# Patient Record
Sex: Male | Born: 1945
Health system: Southern US, Community
[De-identification: ages and names within clinical notes are randomized; demographics above are authoritative.]

## PROBLEM LIST (undated history)

## (undated) DIAGNOSIS — I10 Essential (primary) hypertension: Secondary | ICD-10-CM

## (undated) DIAGNOSIS — F419 Anxiety disorder, unspecified: Secondary | ICD-10-CM

## (undated) HISTORY — PX: EYE SURGERY: SHX253

## (undated) HISTORY — PX: CYST EXCISION: SHX5701

## (undated) HISTORY — PX: SPINE SURGERY: SHX786

## (undated) HISTORY — PX: HEMORRHOID SURGERY: SHX153

## (undated) HISTORY — PX: PILONIDAL CYST EXCISION: SHX744

---

## 2003-01-16 ENCOUNTER — Ambulatory Visit (HOSPITAL_COMMUNITY): Admission: RE | Admit: 2003-01-16 | Discharge: 2003-01-16 | Payer: Self-pay | Admitting: Gastroenterology

## 2013-06-24 ENCOUNTER — Ambulatory Visit (INDEPENDENT_AMBULATORY_CARE_PROVIDER_SITE_OTHER): Payer: Medicare Other | Admitting: Family Medicine

## 2013-06-24 VITALS — BP 150/95 | HR 74 | Temp 97.8°F | Resp 18 | Ht 74.0 in | Wt 259.2 lb

## 2013-06-24 DIAGNOSIS — IMO0001 Reserved for inherently not codable concepts without codable children: Secondary | ICD-10-CM

## 2013-06-24 DIAGNOSIS — R030 Elevated blood-pressure reading, without diagnosis of hypertension: Secondary | ICD-10-CM

## 2013-06-24 DIAGNOSIS — L259 Unspecified contact dermatitis, unspecified cause: Secondary | ICD-10-CM

## 2013-06-24 MED ORDER — TRIAMCINOLONE ACETONIDE 0.1 % EX CREA: 1.0000 "application " | TOPICAL_CREAM | Freq: Two times a day (BID) | CUTANEOUS | Status: DC

## 2013-06-24 NOTE — Patient Instructions (Signed)
Try the Aveeno lotion to dry skin twice per day. Can try prescription steroid cream if needed up to twice per day. Keep a record of your blood pressures outside of the office and bring them to the next office visit. If remains over 140/90 - recommend recheck in next 2 weeks.  Return to the clinic or go to the nearest emergency room if any of your symptoms worsen or new symptoms occur. Contact Dermatitis Contact dermatitis is a reaction to certain substances that touch the skin. Contact dermatitis can be either irritant contact dermatitis or allergic contact dermatitis. Irritant contact dermatitis does not require previous exposure to the substance for a reaction to occur.Allergic contact dermatitis only occurs if you have been exposed to the substance before. Upon a repeat exposure, your body reacts to the substance.  CAUSES  Many substances can cause contact dermatitis. Irritant dermatitis is most commonly caused by repeated exposure to mildly irritating substances, such as:  Makeup.  Soaps.  Detergents.  Bleaches.  Acids.  Metal salts, such as nickel. Allergic contact dermatitis is most commonly caused by exposure to:  Poisonous plants.  Chemicals (deodorants, shampoos).  Jewelry.  Latex.  Neomycin in triple antibiotic cream.  Preservatives in products, including clothing. SYMPTOMS  The area of skin that is exposed may develop:  Dryness or flaking.  Redness.  Cracks.  Itching.  Pain or a burning sensation.  Blisters. With allergic contact dermatitis, there may also be swelling in areas such as the eyelids, mouth, or genitals.  DIAGNOSIS  Your caregiver can usually tell what the problem is by doing a physical exam. In cases where the cause is uncertain and an allergic contact dermatitis is suspected, a patch skin test may be performed to help determine the cause of your dermatitis. TREATMENT Treatment includes protecting the skin from further contact with the  irritating substance by avoiding that substance if possible. Barrier creams, powders, and gloves may be helpful. Your caregiver may also recommend:  Steroid creams or ointments applied 2 times daily. For best results, soak the rash area in cool water for 20 minutes. Then apply the medicine. Cover the area with a plastic wrap. You can store the steroid cream in the refrigerator for a "chilly" effect on your rash. That may decrease itching. Oral steroid medicines may be needed in more severe cases.  Antibiotics or antibacterial ointments if a skin infection is present.  Antihistamine lotion or an antihistamine taken by mouth to ease itching.  Lubricants to keep moisture in your skin.  Burow's solution to reduce redness and soreness or to dry a weeping rash. Mix one packet or tablet of solution in 2 cups cool water. Dip a clean washcloth in the mixture, wring it out a bit, and put it on the affected area. Leave the cloth in place for 30 minutes. Do this as often as possible throughout the day.  Taking several cornstarch or baking soda baths daily if the area is too large to cover with a washcloth. Harsh chemicals, such as alkalis or acids, can cause skin damage that is like a burn. You should flush your skin for 15 to 20 minutes with cold water after such an exposure. You should also seek immediate medical care after exposure. Bandages (dressings), antibiotics, and pain medicine may be needed for severely irritated skin.  HOME CARE INSTRUCTIONS  Avoid the substance that caused your reaction.  Keep the area of skin that is affected away from hot water, soap, sunlight, chemicals, acidic substances, or  anything else that would irritate your skin.  Do not scratch the rash. Scratching may cause the rash to become infected.  You may take cool baths to help stop the itching.  Only take over-the-counter or prescription medicines as directed by your caregiver.  See your caregiver for follow-up care as  directed to make sure your skin is healing properly. SEEK MEDICAL CARE IF:   Your condition is not better after 3 days of treatment.  You seem to be getting worse.  You see signs of infection such as swelling, tenderness, redness, soreness, or warmth in the affected area.  You have any problems related to your medicines. Document Released: 04/21/2000 Document Revised: 07/17/2011 Document Reviewed: 09/27/2010 [facility] Patient Information 2014 [place], [place].

## 2013-06-24 NOTE — Progress Notes (Addendum)
Subjective:    Patient ID: Jesse Sharp, male    DOB: Jul 09, 1945, 68 y.o.   MRN: [id number] .This chart was scribed for Merri Ray, MD by Anastasia Pall, ED Scribe. This patient was seen in room 01 and the patient's care was started at 2:22 PM.  Chief Complaint  Patient presents with  . Rash    Lower legs    Rash Pertinent negatives include no fever.   Jesse Sharp is a 68 y.o. male Pt presents with a non itching, non painful rash over his bilateral shins, onset 2 weeks ago, left LE greater than right. He reports wearing socks recently after not wearing socks for a long period of time. He has tried OTC antibacterial ointment without relief. He denies genital rash, rash over the bottom of his feet. He denies having pets at home. He denies fever, chills, abdominal pain. He reports recently having elevated BP, but reports being healthy otherwise. He denies anyone else he has been in contact with having similar rash.   PCP - No primary provider on file.  There are no active problems to display for this patient.  No past medical history on file. No past surgical history on file. No Known Allergies Prior to Admission medications   Medication Sig Start Date End Date Taking? Authorizing Provider  simvastatin (ZOCOR) 20 MG tablet Take 20 mg by mouth daily.   Yes Historical Provider, MD   Review of Systems  Constitutional: Negative for fever and chills.  Gastrointestinal: Negative for abdominal pain.  Skin: Positive for rash (bilateral shins).      Objective:   Physical Exam  Nursing note and vitals reviewed. Constitutional: He is oriented to person, place, and time. He appears well-developed and well-nourished. No distress.  HENT:  Head: Normocephalic and atraumatic.  Eyes: EOM are normal. Pupils are equal, round, and reactive to light.  Neck: Neck supple. Carotid bruit is not present.  Cardiovascular: Normal rate, regular rhythm and normal heart sounds.   No murmur  heard. DP 2 +.  Pulmonary/Chest: Effort normal and breath sounds normal. No respiratory distress. He has no wheezes. He has no rales.  Abdominal: Soft. There is no tenderness.  Musculoskeletal: Normal range of motion. He exhibits no edema.  Neurological: He is alert and oriented to person, place, and time. No sensory deficit.  Neurovascular intact distally.   Skin: Skin is warm and dry. Rash noted.  Dry skin right LE. Sparsity of LE hair. Right LE few small erythematous papules with minimal excoriation. Left LE anterior surface mid tibia patch of confluent erythematous papules with slight excoriations. No surrounding erythema. No induration. No other rash on body.   Psychiatric: He has a normal mood and affect. His behavior is normal.   BP 142/100  Pulse 74  Temp(Src) 97.8 F (36.6 C) (Oral)  Resp 18  Ht 6\' 2"  (1.88 m)  Wt 259 lb 3.2 oz (117.572 kg)  BMI 33.27 kg/m2  SpO2 97%  BP recheck - 150/95    Assessment & Plan:   Jesse Sharp is a 68 y.o. male Elevated BP - no known hx of htn. Check outside bp's as below, rtc precautions discussed.   Contact dermatitis - dry skin on legs, possible secondary contact dermatitis. Stop topical antibiotic as possible neomycin irritation. Trial of Aveeno topical BID and topical TAC 0.1% BID PRN. If not improving in next 7-10 days recheck sooner if worse.    Meds ordered this encounter           .  triamcinolone cream (KENALOG) 0.1 %    Sig: Apply 1 application topically 2 (two) times daily.    Dispense:  30 g    Refill:  0      Patient Instructions  Try the Aveeno lotion to dry skin twice per day. Can try prescription steroid cream if needed up to twice per day. Keep a record of your blood pressures outside of the office and bring them to the next office visit. If remains over 140/90 - recommend recheck in next 2 weeks.  Return to the clinic or go to the nearest emergency room if any of your symptoms worsen or new symptoms  occur. Contact Dermatitis Contact dermatitis is a reaction to certain substances that touch the skin. Contact dermatitis can be either irritant contact dermatitis or allergic contact dermatitis. Irritant contact dermatitis does not require previous exposure to the substance for a reaction to occur.Allergic contact dermatitis only occurs if you have been exposed to the substance before. Upon a repeat exposure, your body reacts to the substance.  CAUSES  Many substances can cause contact dermatitis. Irritant dermatitis is most commonly caused by repeated exposure to mildly irritating substances, such as:  Makeup.  Soaps.  Detergents.  Bleaches.  Acids.  Metal salts, such as nickel. Allergic contact dermatitis is most commonly caused by exposure to:  Poisonous plants.  Chemicals (deodorants, shampoos).  Jewelry.  Latex.  Neomycin in triple antibiotic cream.  Preservatives in products, including clothing. SYMPTOMS  The area of skin that is exposed may develop:  Dryness or flaking.  Redness.  Cracks.  Itching.  Pain or a burning sensation.  Blisters. With allergic contact dermatitis, there may also be swelling in areas such as the eyelids, mouth, or genitals.  DIAGNOSIS  Your caregiver can usually tell what the problem is by doing a physical exam. In cases where the cause is uncertain and an allergic contact dermatitis is suspected, a patch skin test may be performed to help determine the cause of your dermatitis. TREATMENT Treatment includes protecting the skin from further contact with the irritating substance by avoiding that substance if possible. Barrier creams, powders, and gloves may be helpful. Your caregiver may also recommend:  Steroid creams or ointments applied 2 times daily. For best results, soak the rash area in cool water for 20 minutes. Then apply the medicine. Cover the area with a plastic wrap. You can store the steroid cream in the refrigerator for a  "chilly" effect on your rash. That may decrease itching. Oral steroid medicines may be needed in more severe cases.  Antibiotics or antibacterial ointments if a skin infection is present.  Antihistamine lotion or an antihistamine taken by mouth to ease itching.  Lubricants to keep moisture in your skin.  Burow's solution to reduce redness and soreness or to dry a weeping rash. Mix one packet or tablet of solution in 2 cups cool water. Dip a clean washcloth in the mixture, wring it out a bit, and put it on the affected area. Leave the cloth in place for 30 minutes. Do this as often as possible throughout the day.  Taking several cornstarch or baking soda baths daily if the area is too large to cover with a washcloth. Harsh chemicals, such as alkalis or acids, can cause skin damage that is like a burn. You should flush your skin for 15 to 20 minutes with cold water after such an exposure. You should also seek immediate medical care after exposure. Bandages (dressings), antibiotics, and pain  medicine may be needed for severely irritated skin.  HOME CARE INSTRUCTIONS  Avoid the substance that caused your reaction.  Keep the area of skin that is affected away from hot water, soap, sunlight, chemicals, acidic substances, or anything else that would irritate your skin.  Do not scratch the rash. Scratching may cause the rash to become infected.  You may take cool baths to help stop the itching.  Only take over-the-counter or prescription medicines as directed by your caregiver.  See your caregiver for follow-up care as directed to make sure your skin is healing properly. SEEK MEDICAL CARE IF:   Your condition is not better after 3 days of treatment.  You seem to be getting worse.  You see signs of infection such as swelling, tenderness, redness, soreness, or warmth in the affected area.  You have any problems related to your medicines. Document Released: 04/21/2000 Document Revised:  07/17/2011 Document Reviewed: 09/27/2010 [facility] Patient Information 2014 [place], [place].

## 2018-05-16 ENCOUNTER — Other Ambulatory Visit: Payer: Self-pay | Admitting: Internal Medicine

## 2018-05-16 ENCOUNTER — Ambulatory Visit
Admission: RE | Admit: 2018-05-16 | Discharge: 2018-05-16 | Disposition: A | Discharge status: Home / Self Care | Payer: Self-pay | Source: Ambulatory Visit | Attending: Internal Medicine | Admitting: Internal Medicine

## 2018-05-16 DIAGNOSIS — R0789 Other chest pain: Secondary | ICD-10-CM

## 2019-03-17 ENCOUNTER — Other Ambulatory Visit: Payer: Self-pay | Admitting: Internal Medicine

## 2019-03-17 DIAGNOSIS — Z136 Encounter for screening for cardiovascular disorders: Secondary | ICD-10-CM

## 2019-07-22 DIAGNOSIS — M9903 Segmental and somatic dysfunction of lumbar region: Secondary | ICD-10-CM | POA: Diagnosis not present

## 2019-07-22 DIAGNOSIS — M5116 Intervertebral disc disorders with radiculopathy, lumbar region: Secondary | ICD-10-CM | POA: Diagnosis not present

## 2019-07-23 DIAGNOSIS — I10 Essential (primary) hypertension: Secondary | ICD-10-CM | POA: Diagnosis not present

## 2019-08-26 DIAGNOSIS — M9903 Segmental and somatic dysfunction of lumbar region: Secondary | ICD-10-CM | POA: Diagnosis not present

## 2019-08-26 DIAGNOSIS — M5116 Intervertebral disc disorders with radiculopathy, lumbar region: Secondary | ICD-10-CM | POA: Diagnosis not present

## 2019-09-23 DIAGNOSIS — M9903 Segmental and somatic dysfunction of lumbar region: Secondary | ICD-10-CM | POA: Diagnosis not present

## 2019-09-23 DIAGNOSIS — M5116 Intervertebral disc disorders with radiculopathy, lumbar region: Secondary | ICD-10-CM | POA: Diagnosis not present

## 2019-09-30 DIAGNOSIS — E785 Hyperlipidemia, unspecified: Secondary | ICD-10-CM | POA: Diagnosis not present

## 2019-09-30 DIAGNOSIS — I10 Essential (primary) hypertension: Secondary | ICD-10-CM | POA: Diagnosis not present

## 2019-10-03 DIAGNOSIS — I10 Essential (primary) hypertension: Secondary | ICD-10-CM | POA: Diagnosis not present

## 2019-10-03 DIAGNOSIS — I440 Atrioventricular block, first degree: Secondary | ICD-10-CM | POA: Diagnosis not present

## 2019-10-03 DIAGNOSIS — Z23 Encounter for immunization: Secondary | ICD-10-CM | POA: Diagnosis not present

## 2019-10-03 DIAGNOSIS — R42 Dizziness and giddiness: Secondary | ICD-10-CM | POA: Diagnosis not present

## 2019-10-14 DIAGNOSIS — M9903 Segmental and somatic dysfunction of lumbar region: Secondary | ICD-10-CM | POA: Diagnosis not present

## 2019-10-14 DIAGNOSIS — M5116 Intervertebral disc disorders with radiculopathy, lumbar region: Secondary | ICD-10-CM | POA: Diagnosis not present

## 2019-11-11 DIAGNOSIS — M5116 Intervertebral disc disorders with radiculopathy, lumbar region: Secondary | ICD-10-CM | POA: Diagnosis not present

## 2019-11-11 DIAGNOSIS — M9903 Segmental and somatic dysfunction of lumbar region: Secondary | ICD-10-CM | POA: Diagnosis not present

## 2019-12-02 DIAGNOSIS — H35371 Puckering of macula, right eye: Secondary | ICD-10-CM | POA: Diagnosis not present

## 2019-12-02 DIAGNOSIS — Z961 Presence of intraocular lens: Secondary | ICD-10-CM | POA: Diagnosis not present

## 2019-12-16 DIAGNOSIS — M9903 Segmental and somatic dysfunction of lumbar region: Secondary | ICD-10-CM | POA: Diagnosis not present

## 2019-12-16 DIAGNOSIS — M5116 Intervertebral disc disorders with radiculopathy, lumbar region: Secondary | ICD-10-CM | POA: Diagnosis not present

## 2019-12-23 DIAGNOSIS — M5116 Intervertebral disc disorders with radiculopathy, lumbar region: Secondary | ICD-10-CM | POA: Diagnosis not present

## 2019-12-23 DIAGNOSIS — M9903 Segmental and somatic dysfunction of lumbar region: Secondary | ICD-10-CM | POA: Diagnosis not present

## 2020-01-13 DIAGNOSIS — M5116 Intervertebral disc disorders with radiculopathy, lumbar region: Secondary | ICD-10-CM | POA: Diagnosis not present

## 2020-01-13 DIAGNOSIS — M9903 Segmental and somatic dysfunction of lumbar region: Secondary | ICD-10-CM | POA: Diagnosis not present

## 2020-02-02 DIAGNOSIS — Z0001 Encounter for general adult medical examination with abnormal findings: Secondary | ICD-10-CM | POA: Diagnosis not present

## 2020-02-02 DIAGNOSIS — Z1211 Encounter for screening for malignant neoplasm of colon: Secondary | ICD-10-CM | POA: Diagnosis not present

## 2020-02-02 DIAGNOSIS — G4733 Obstructive sleep apnea (adult) (pediatric): Secondary | ICD-10-CM | POA: Diagnosis not present

## 2020-02-02 DIAGNOSIS — M7062 Trochanteric bursitis, left hip: Secondary | ICD-10-CM | POA: Diagnosis not present

## 2020-02-03 DIAGNOSIS — Z0001 Encounter for general adult medical examination with abnormal findings: Secondary | ICD-10-CM | POA: Diagnosis not present

## 2020-02-03 DIAGNOSIS — E785 Hyperlipidemia, unspecified: Secondary | ICD-10-CM | POA: Diagnosis not present

## 2020-02-04 ENCOUNTER — Ambulatory Visit
Admission: RE | Admit: 2020-02-04 | Discharge: 2020-02-04 | Disposition: A | Discharge status: Home / Self Care | Payer: Medicare Other | Source: Ambulatory Visit | Attending: Internal Medicine | Admitting: Internal Medicine

## 2020-02-04 DIAGNOSIS — Z136 Encounter for screening for cardiovascular disorders: Secondary | ICD-10-CM

## 2020-02-04 DIAGNOSIS — Z87891 Personal history of nicotine dependence: Secondary | ICD-10-CM | POA: Diagnosis not present

## 2020-02-05 ENCOUNTER — Ambulatory Visit: Payer: Self-pay

## 2020-02-12 DIAGNOSIS — M9903 Segmental and somatic dysfunction of lumbar region: Secondary | ICD-10-CM | POA: Diagnosis not present

## 2020-02-12 DIAGNOSIS — M5116 Intervertebral disc disorders with radiculopathy, lumbar region: Secondary | ICD-10-CM | POA: Diagnosis not present

## 2020-02-16 DIAGNOSIS — I10 Essential (primary) hypertension: Secondary | ICD-10-CM | POA: Diagnosis not present

## 2020-02-16 DIAGNOSIS — R059 Cough, unspecified: Secondary | ICD-10-CM | POA: Diagnosis not present

## 2020-02-16 DIAGNOSIS — R490 Dysphonia: Secondary | ICD-10-CM | POA: Diagnosis not present

## 2020-02-17 DIAGNOSIS — R059 Cough, unspecified: Secondary | ICD-10-CM | POA: Diagnosis not present

## 2020-02-17 DIAGNOSIS — K2970 Gastritis, unspecified, without bleeding: Secondary | ICD-10-CM | POA: Diagnosis not present

## 2020-02-17 DIAGNOSIS — K319 Disease of stomach and duodenum, unspecified: Secondary | ICD-10-CM | POA: Diagnosis not present

## 2020-02-17 DIAGNOSIS — K2971 Gastritis, unspecified, with bleeding: Secondary | ICD-10-CM | POA: Diagnosis not present

## 2020-03-04 DIAGNOSIS — M5116 Intervertebral disc disorders with radiculopathy, lumbar region: Secondary | ICD-10-CM | POA: Diagnosis not present

## 2020-03-04 DIAGNOSIS — M9903 Segmental and somatic dysfunction of lumbar region: Secondary | ICD-10-CM | POA: Diagnosis not present

## 2020-03-17 DIAGNOSIS — R490 Dysphonia: Secondary | ICD-10-CM | POA: Diagnosis not present

## 2020-03-17 DIAGNOSIS — D380 Neoplasm of uncertain behavior of larynx: Secondary | ICD-10-CM | POA: Diagnosis not present

## 2020-03-18 ENCOUNTER — Encounter (HOSPITAL_BASED_OUTPATIENT_CLINIC_OR_DEPARTMENT_OTHER): Payer: Self-pay | Admitting: Otolaryngology

## 2020-03-18 ENCOUNTER — Other Ambulatory Visit: Payer: Self-pay

## 2020-03-19 ENCOUNTER — Other Ambulatory Visit (HOSPITAL_COMMUNITY)
Admission: RE | Admit: 2020-03-19 | Discharge: 2020-03-19 | Disposition: A | Discharge status: Home / Self Care | Payer: Medicare Other | Source: Ambulatory Visit | Attending: Otolaryngology | Admitting: Otolaryngology

## 2020-03-19 ENCOUNTER — Encounter (HOSPITAL_BASED_OUTPATIENT_CLINIC_OR_DEPARTMENT_OTHER)
Admission: RE | Admit: 2020-03-19 | Discharge: 2020-03-19 | Disposition: A | Discharge status: Home / Self Care | Payer: Medicare Other | Source: Ambulatory Visit | Attending: General Surgery | Admitting: General Surgery

## 2020-03-19 DIAGNOSIS — Z01812 Encounter for preprocedural laboratory examination: Secondary | ICD-10-CM | POA: Diagnosis not present

## 2020-03-19 DIAGNOSIS — Z20822 Contact with and (suspected) exposure to covid-19: Secondary | ICD-10-CM | POA: Diagnosis not present

## 2020-03-19 DIAGNOSIS — Z01810 Encounter for preprocedural cardiovascular examination: Secondary | ICD-10-CM | POA: Insufficient documentation

## 2020-03-19 LAB — SARS CORONAVIRUS 2 (TAT 6-24 HRS): SARS Coronavirus 2: NEGATIVE

## 2020-03-23 ENCOUNTER — Encounter (HOSPITAL_BASED_OUTPATIENT_CLINIC_OR_DEPARTMENT_OTHER)
Admission: RE | Disposition: A | Discharge status: Home / Self Care | Payer: Self-pay | Source: Home / Self Care | Attending: Otolaryngology

## 2020-03-23 ENCOUNTER — Ambulatory Visit (HOSPITAL_BASED_OUTPATIENT_CLINIC_OR_DEPARTMENT_OTHER): Payer: Medicare Other | Admitting: Anesthesiology

## 2020-03-23 ENCOUNTER — Other Ambulatory Visit: Payer: Self-pay

## 2020-03-23 ENCOUNTER — Encounter (HOSPITAL_BASED_OUTPATIENT_CLINIC_OR_DEPARTMENT_OTHER): Payer: Self-pay | Admitting: Otolaryngology

## 2020-03-23 ENCOUNTER — Ambulatory Visit (HOSPITAL_BASED_OUTPATIENT_CLINIC_OR_DEPARTMENT_OTHER)
Admission: RE | Admit: 2020-03-23 | Discharge: 2020-03-23 | Disposition: A | Discharge status: Home / Self Care | Payer: Medicare Other | Attending: Otolaryngology | Admitting: Otolaryngology

## 2020-03-23 DIAGNOSIS — I10 Essential (primary) hypertension: Secondary | ICD-10-CM | POA: Diagnosis not present

## 2020-03-23 DIAGNOSIS — R490 Dysphonia: Secondary | ICD-10-CM | POA: Insufficient documentation

## 2020-03-23 DIAGNOSIS — J382 Nodules of vocal cords: Secondary | ICD-10-CM | POA: Diagnosis not present

## 2020-03-23 DIAGNOSIS — Z87891 Personal history of nicotine dependence: Secondary | ICD-10-CM | POA: Insufficient documentation

## 2020-03-23 DIAGNOSIS — C320 Malignant neoplasm of glottis: Secondary | ICD-10-CM | POA: Diagnosis not present

## 2020-03-23 DIAGNOSIS — D380 Neoplasm of uncertain behavior of larynx: Secondary | ICD-10-CM | POA: Diagnosis not present

## 2020-03-23 HISTORY — PX: MICROLARYNGOSCOPY: SHX5208

## 2020-03-23 HISTORY — DX: Anxiety disorder, unspecified: F41.9

## 2020-03-23 HISTORY — DX: Essential (primary) hypertension: I10

## 2020-03-23 SURGERY — MICROLARYNGOSCOPY
Anesthesia: General | Site: Throat

## 2020-03-23 MED ORDER — FENTANYL CITRATE (PF) 100 MCG/2ML IJ SOLN
INTRAMUSCULAR | Status: DC | PRN
  Administered 2020-03-23: 100 ug via INTRAVENOUS

## 2020-03-23 MED ORDER — LIDOCAINE 2% (20 MG/ML) 5 ML SYRINGE
INTRAMUSCULAR | Status: AC
  Filled 2020-03-23: qty 5

## 2020-03-23 MED ORDER — EPHEDRINE SULFATE 50 MG/ML IJ SOLN
INTRAMUSCULAR | Status: DC | PRN
  Administered 2020-03-23: 10 mg via INTRAVENOUS
  Administered 2020-03-23 (×2): 15 mg via INTRAVENOUS

## 2020-03-23 MED ORDER — EPINEPHRINE PF 1 MG/ML IJ SOLN
INTRAMUSCULAR | Status: DC | PRN
  Administered 2020-03-23: .5 mL

## 2020-03-23 MED ORDER — LIDOCAINE HCL (CARDIAC) PF 100 MG/5ML IV SOSY
PREFILLED_SYRINGE | INTRAVENOUS | Status: DC | PRN
  Administered 2020-03-23: 100 mg via INTRAVENOUS

## 2020-03-23 MED ORDER — FENTANYL CITRATE (PF) 100 MCG/2ML IJ SOLN
INTRAMUSCULAR | Status: AC
  Filled 2020-03-23: qty 2

## 2020-03-23 MED ORDER — ONDANSETRON HCL 4 MG/2ML IJ SOLN
INTRAMUSCULAR | Status: AC
  Filled 2020-03-23: qty 2

## 2020-03-23 MED ORDER — SUGAMMADEX SODIUM 500 MG/5ML IV SOLN
INTRAVENOUS | Status: DC | PRN
  Administered 2020-03-23: 400 mg via INTRAVENOUS

## 2020-03-23 MED ORDER — PROPOFOL 10 MG/ML IV BOLUS
INTRAVENOUS | Status: AC
  Filled 2020-03-23: qty 20

## 2020-03-23 MED ORDER — ONDANSETRON HCL 4 MG/2ML IJ SOLN
INTRAMUSCULAR | Status: DC | PRN
  Administered 2020-03-23: 4 mg via INTRAVENOUS

## 2020-03-23 MED ORDER — CEFAZOLIN SODIUM-DEXTROSE 2-3 GM-%(50ML) IV SOLR
INTRAVENOUS | Status: DC | PRN
  Administered 2020-03-23: 2 g via INTRAVENOUS

## 2020-03-23 MED ORDER — ROCURONIUM BROMIDE 100 MG/10ML IV SOLN
INTRAVENOUS | Status: DC | PRN
  Administered 2020-03-23: 50 mg via INTRAVENOUS

## 2020-03-23 MED ORDER — FENTANYL CITRATE (PF) 100 MCG/2ML IJ SOLN: 25.0000 ug | INTRAMUSCULAR | Status: DC | PRN

## 2020-03-23 MED ORDER — DEXAMETHASONE SODIUM PHOSPHATE 4 MG/ML IJ SOLN
INTRAMUSCULAR | Status: DC | PRN
  Administered 2020-03-23: 10 mg via INTRAVENOUS

## 2020-03-23 MED ORDER — LACTATED RINGERS IV SOLN: INTRAVENOUS | Status: DC

## 2020-03-23 MED ORDER — GLYCOPYRROLATE PF 0.2 MG/ML IJ SOSY
PREFILLED_SYRINGE | INTRAMUSCULAR | Status: AC
  Filled 2020-03-23: qty 1

## 2020-03-23 MED ORDER — PROPOFOL 10 MG/ML IV BOLUS
INTRAVENOUS | Status: DC | PRN
  Administered 2020-03-23: 150 mg via INTRAVENOUS

## 2020-03-23 MED ORDER — ACETAMINOPHEN 500 MG PO TABS
1000.0000 mg | ORAL_TABLET | Freq: Once | ORAL | Status: AC
  Administered 2020-03-23: 1000 mg via ORAL

## 2020-03-23 MED ORDER — ACETAMINOPHEN 500 MG PO TABS
ORAL_TABLET | ORAL | Status: AC
  Filled 2020-03-23: qty 2

## 2020-03-23 MED ORDER — SODIUM CHLORIDE 0.9 % IV SOLN
INTRAVENOUS | Status: DC | PRN
  Administered 2020-03-23: 120 ug via INTRAVENOUS

## 2020-03-23 MED ORDER — DEXAMETHASONE SODIUM PHOSPHATE 10 MG/ML IJ SOLN
INTRAMUSCULAR | Status: AC
  Filled 2020-03-23: qty 1

## 2020-03-23 SURGICAL SUPPLY — 23 items
CANISTER SUCT 1200ML W/VALVE (MISCELLANEOUS) ×3 IMPLANT
COVER WAND RF STERILE (DRAPES) IMPLANT
GAUZE SPONGE 4X4 12PLY STRL LF (GAUZE/BANDAGES/DRESSINGS) ×6 IMPLANT
GLOVE BIO SURGEON STRL SZ7.5 (GLOVE) ×3 IMPLANT
GLOVE ECLIPSE 6.5 STRL STRAW (GLOVE) ×3 IMPLANT
GOWN STRL REUS W/ TWL LRG LVL3 (GOWN DISPOSABLE) ×2 IMPLANT
GOWN STRL REUS W/TWL LRG LVL3 (GOWN DISPOSABLE) ×3
GUARD TEETH (MISCELLANEOUS) ×3 IMPLANT
MARKER SKIN DUAL TIP RULER LAB (MISCELLANEOUS) IMPLANT
NEEDLE HYPO 18GX1.5 BLUNT FILL (NEEDLE) ×3 IMPLANT
NEEDLE SPNL 22GX7 QUINCKE BK (NEEDLE) IMPLANT
NEEDLE SPNL 25GX3.5 QUINCKE BL (NEEDLE) ×3 IMPLANT
NS IRRIG 1000ML POUR BTL (IV SOLUTION) ×3 IMPLANT
PACK BASIN DAY SURGERY FS (CUSTOM PROCEDURE TRAY) ×3 IMPLANT
PATTIES SURGICAL .5 X3 (DISPOSABLE) ×3 IMPLANT
SHEET MEDIUM DRAPE 40X70 STRL (DRAPES) ×3 IMPLANT
SLEEVE SCD COMPRESS KNEE MED (MISCELLANEOUS) ×3 IMPLANT
SOLUTION BUTLER CLEAR DIP (MISCELLANEOUS) ×3 IMPLANT
SURGILUBE 2OZ TUBE FLIPTOP (MISCELLANEOUS) IMPLANT
SYR CONTROL 10ML LL (SYRINGE) IMPLANT
SYR TB 1ML LL NO SAFETY (SYRINGE) ×3 IMPLANT
TOWEL GREEN STERILE FF (TOWEL DISPOSABLE) ×3 IMPLANT
TUBE CONNECTING 20X1/4 (TUBING) ×3 IMPLANT

## 2020-03-23 NOTE — Anesthesia Preprocedure Evaluation (Addendum)
Anesthesia Evaluation  Patient identified by MRN, date of birth, ID band Patient awake    Reviewed: Allergy & Precautions, NPO status , Patient's Chart, lab work & pertinent test results  Airway Mallampati: III  TM Distance: >3 FB Neck ROM: Full    Dental no notable dental hx. (+) Chipped, Dental Advisory Given,    Pulmonary neg pulmonary ROS, former smoker,    Pulmonary exam normal breath sounds clear to auscultation       Cardiovascular hypertension, Pt. on medications negative cardio ROS Normal cardiovascular exam Rhythm:Regular Rate:Normal     Neuro/Psych PSYCHIATRIC DISORDERS Anxiety negative neurological ROS     GI/Hepatic negative GI ROS, Neg liver ROS,   Endo/Other  negative endocrine ROS  Renal/GU negative Renal ROS  negative genitourinary   Musculoskeletal negative musculoskeletal ROS (+)   Abdominal   Peds  Hematology negative hematology ROS (+)   Anesthesia Other Findings   Reproductive/Obstetrics                            Anesthesia Physical Anesthesia Plan  ASA: II  Anesthesia Plan: General   Post-op Pain Management:    Induction: Intravenous  PONV Risk Score and Plan: 2 and Midazolam, Dexamethasone, Ondansetron and Propofol infusion  Airway Management Planned: Oral ETT  Additional Equipment:   Intra-op Plan:   Post-operative Plan: Extubation in OR  Informed Consent: I have reviewed the patients History and Physical, chart, labs and discussed the procedure including the risks, benefits and alternatives for the proposed anesthesia with the patient or authorized representative who has indicated his/her understanding and acceptance.     Dental advisory given  Plan Discussed with: CRNA  Anesthesia Plan Comments:        Anesthesia Quick Evaluation

## 2020-03-23 NOTE — Discharge Instructions (Signed)
Next dose of Tylenol if needed not until 3pm  Post Anesthesia Home Care Instructions  Activity: Get plenty of rest for the remainder of the day. A responsible individual must stay with you for 24 hours following the procedure.  For the next 24 hours, DO NOT: -Drive a car -Paediatric nurse -Drink alcoholic beverages -Take any medication unless instructed by your physician -Make any legal decisions or sign important papers.  Meals: Start with liquid foods such as gelatin or soup. Progress to regular foods as tolerated. Avoid greasy, spicy, heavy foods. If nausea and/or vomiting occur, drink only clear liquids until the nausea and/or vomiting subsides. Call your physician if vomiting continues.  Special Instructions/Symptoms: Your throat may feel dry or sore from the anesthesia or the breathing tube placed in your throat during surgery. If this causes discomfort, gargle with warm salt water. The discomfort should disappear within 24 hours.  If you had a scopolamine patch placed behind your ear for the management of post- operative nausea and/or vomiting:  1. The medication in the patch is effective for 72 hours, after which it should be removed.  Wrap patch in a tissue and discard in the trash. Wash hands thoroughly with soap and water. 2. You may remove the patch earlier than 72 hours if you experience unpleasant side effects which may include dry mouth, dizziness or visual disturbances. 3. Avoid touching the patch. Wash your hands with soap and water after contact with the patch.    -----------------------------  The patient may resume all his previous activities and diet.  He may use Tylenol/ibuprofen as needed for his pain.  He will follow-up in my office on December 1.

## 2020-03-23 NOTE — Transfer of Care (Addendum)
Immediate Anesthesia Transfer of Care Note  Patient: Jesse Sharp  Procedure(s) Performed: MICRO LARYNGOSCOPY WITH EXCISION OF VOCAL CORD NODULE (N/A Throat)  Patient Location: PACU  Anesthesia Type:General  Level of Consciousness: awake, alert  and oriented  Airway & Oxygen Therapy: Patient Spontanous Breathing and Patient connected to face mask oxygen  Post-op Assessment: Report given to RN and Post -op Vital signs reviewed and stable  Post vital signs: Reviewed and stable  Last Vitals:  Vitals Value Taken Time  BP    Temp    Pulse 114 03/23/20 1008  Resp 11 03/23/20 1008  SpO2 99 % 03/23/20 1008  Vitals shown include unvalidated device data.  Last Pain:  Vitals:   03/23/20 0757  TempSrc: Oral  PainSc: 0-No pain         Complications: No complications documented.

## 2020-03-23 NOTE — Op Note (Signed)
DATE OF PROCEDURE:  03/23/2020                              OPERATIVE REPORT  SURGEON:  Leta Baptist, MD  PREOPERATIVE DIAGNOSES: 1.  Chronic hoarseness. 2.  Vocal cord anterior commissure mass.  POSTOPERATIVE DIAGNOSES: 1.  Chronic hoarseness. 2.  Vocal cord anterior commissure mass.  PROCEDURE PERFORMED: MicroDirect laryngoscopy with excision of vocal cord mass.  ANESTHESIA:  General endotracheal tube anesthesia.  COMPLICATIONS:  None.  ESTIMATED BLOOD LOSS:  Minimal.  INDICATION FOR PROCEDURE:  Jesse Sharp is a 74 y.o. male with a history of chronic hoarseness. 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 Based on the above findings, the decision was made for the patient to undergo the above stated procedure. Likelihood of success in reducing symptoms was also discussed.  The risks, benefits, alternatives, and details of the procedure were discussed with the patient.  Questions were invited and answered.  Informed consent was obtained.  DESCRIPTION:  The patient was taken to the operating room and placed supine on the operating table.  General endotracheal tube anesthesia was administered by the anesthesiologist.  The patient was positioned and prepped and draped in a standard fashion for direct laryngoscopy.  A Dedo laryngoscope was used for examination.  The laryngoscope was inserted via the oral cavity into the pharynx.  Examination of the epiglottis, vallecula, and piriform sinuses were all normal.  Examination of the glottis revealed a 3 mm anterior commissure soft tissue mass.  The Dedo laryngoscope was suspended with a Lewy suspender.  A 0 degree endoscope was used to obtain photodocumentation of the larynx.  An operating microscope was brought into the surgical field.  Under the operating microscope, the soft tissue mass was excised using a combination of laryngeal forceps and laryngeal scissors.  The  specimen was sent to the pathology department for permanent histologic identification.  Hemostasis was achieved with pledgets soaked with epinephrine.  The care of the patient was turned over to the anesthesiologist.  The patient was awakened from anesthesia without difficulty.  The patient was extubated and transferred to the recovery room in good condition.  OPERATIVE FINDINGS:  A 78mm anterior commissure soft tissue mass.  SPECIMEN: Laryngeal mass.  FOLLOWUP CARE:  The patient will be discharged home once awake and alert. The patient will follow up in my office in approximately 2 weeks.  Shley Dolby W Kimari Coudriet 03/23/2020 10:06 AM

## 2020-03-23 NOTE — H&P (Signed)
Cc: Chronic hoarseness  HPI: The patient is a 74 y/o male who presents today for evaluation of hoarseness. The patient is seen in consultation requested by [facility]. The patient noted onset of hoarseness in June. At that time, he was unable to talk above a whisper. The patient denies any associated dysphagia or odynophagia. He had an upper endoscopy in October with no suspicious mass or lesion noted. He was started on omeprazole daily for possible laryngopharyngeal reflux. The patient has noted a slight improvement in his voice since starting the medication. The patient has noted some intermittent sinus drainage. He quit smoking in 1996. Previous ENT surgery is denied.   The patient's review of systems (constitutional, eyes, ENT, cardiovascular, respiratory, GI, musculoskeletal, skin, neurologic, psychiatric, endocrine, hematologic, allergic) is noted in the ROS questionnaire.  It is reviewed with the patient.   Family health history: No HTN, DM, CAD, hearing loss or bleeding disorder.  Major events: Cataract surgery.  Ongoing medical problems: Anxiety, reflux, hypertension.  Social history: The patient is single.  He denies the use of tobacco, alcohol, or illegal drugs.  Exam: General: Communicates without difficulty, well nourished, no acute distress. Head: Normocephalic, no evidence injury, no tenderness, facial buttresses intact without stepoff. Eyes: PERRL, EOMI.  No scleral icterus, conjunctivae clear. Ears: External auditory canals clear bilaterally.  There is no edema or erythema.  Tympanic membrane is within normal limits bilaterally. Nose: Normal skin and external support.  Anterior rhinoscopy reveals healthy pink mucosa over the septum and turbinates.  No lesions or polyps were seen. Oral cavity: Lips without lesions, oral mucosa moist, no masses or lesions seen. Indirect  mirror laryngoscopy could not be tolerated. Pharynx: Clear, no erythema. Neck: Supple, full range of  motion, no lymphadenopathy, no masses palpable. Salivary: Parotid and submandibular glands without mass. Neuro:  CN 2-12 grossly intact. Gait normal.   Procedure:  Flexible Fiberoptic Laryngoscopy -- Risks, benefits, and alternatives of flexible endoscopy were explained to the patient.  Specific mention was made of the risk of throat numbness with difficulty swallowing, possible bleeding from the nose and mouth, and pain from the procedure.  The patient gave oral consent to proceed.  The nasal cavities were decongested and anesthetised with a combination of oxymetazoline and 4% lidocaine solution.  The flexible scope was inserted into the right nasal cavity and advanced towards the nasopharynx.  Visualized mucosa over the turbinates and septum were normal.  The nasopharynx was clear.  Oropharyngeal walls were symmetric and mobile without lesion, mass, or edema.  Hypopharynx was also without  lesion or edema.  Larynx was mobile without lesions.  No lesions or asymmetry in the supraglottic larynx.  Arytenoid mucosa was edematous with slight erythema.  True vocal folds were pale yellow and edematous with an anterior commissure soft tissue mass.  Base of tongue was within normal limits. The patient tolerated the procedure well.   Assessment 1. 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 This is the cause of his hoarseness. No other suspicious mass or lesion is noted on today's fiberoptic laryngoscopy exam.  Plan  1. The physical exam and laryngoscopy findings are reviewed with the patient.  2. Recommend micro direct laryngoscopy with excision of vocal cord mass. The risks, benefits, alternatives, and details of the procedure are reviewed with the patient. Questions are invited and answered. 3. The patient is interested in proceeding with the procedure.  We will  schedule the procedure in accordance with the family schedule.

## 2020-03-23 NOTE — Anesthesia Postprocedure Evaluation (Signed)
Anesthesia Post Note  Patient: Jesse Sharp  Procedure(s) Performed: MICRO LARYNGOSCOPY WITH EXCISION OF VOCAL CORD NODULE (N/A Throat)     Patient location during evaluation: PACU Anesthesia Type: General Level of consciousness: awake and alert Pain management: pain level controlled Vital Signs Assessment: post-procedure vital signs reviewed and stable Respiratory status: spontaneous breathing, nonlabored ventilation, respiratory function stable and patient connected to nasal cannula oxygen Cardiovascular status: blood pressure returned to baseline and stable Postop Assessment: no apparent nausea or vomiting Anesthetic complications: no   No complications documented.  Last Vitals:  Vitals:   03/23/20 1030 03/23/20 1052  BP: 129/87 (!) 145/96  Pulse: (!) 101 93  Resp: 20 18  Temp:  36.5 C  SpO2: 96% 97%    Last Pain:  Vitals:   03/23/20 1052  TempSrc:   PainSc: 0-No pain                 Susannah Carbin L Maricella Filyaw

## 2020-03-23 NOTE — Anesthesia Procedure Notes (Signed)
Procedure Name: Intubation Performed by: Verita Lamb, CRNA Pre-anesthesia Checklist: Patient identified, Emergency Drugs available, Suction available and Patient being monitored Patient Re-evaluated:Patient Re-evaluated prior to induction Oxygen Delivery Method: Circle system utilized Preoxygenation: Pre-oxygenation with 100% oxygen Induction Type: IV induction Ventilation: Mask ventilation without difficulty Laryngoscope Size: Mac and 4 Grade View: Grade I Tube type: Oral Tube size: 6.0 mm Number of attempts: 1 Airway Equipment and Method: Stylet and Oral airway Placement Confirmation: ETT inserted through vocal cords under direct vision,  positive ETCO2,  breath sounds checked- equal and bilateral and CO2 detector Secured at: 24 cm Tube secured with: Tape Dental Injury: Teeth and Oropharynx as per pre-operative assessment

## 2020-03-24 ENCOUNTER — Encounter (HOSPITAL_BASED_OUTPATIENT_CLINIC_OR_DEPARTMENT_OTHER): Payer: Self-pay | Admitting: Otolaryngology

## 2020-03-24 LAB — SURGICAL PATHOLOGY

## 2020-04-05 NOTE — Progress Notes (Signed)
Head and Neck Cancer Location of Tumor / Histology:  Squamous cell carcinoma of vocal cord  Patient presented with symptoms of: new onset of vocal hoarseness in June. At that time, he was unable to talk above a whisper. He had an upper endoscopy in October with no suspicious mass or lesion noted. He was started on omeprazole daily for possible laryngopharyngeal reflux. Patient noted a slight improvement in his voice since starting the medication. The patient has noted some intermittent sinus drainage.   Biopsies revealed:  03/23/2020 FINAL MICROSCOPIC DIAGNOSIS:  A. VOCAL CORD MASS, BIOPSY:  - Squamous cell carcinoma.  Nutrition Status Yes No Comments  Weight changes? []  [x]    Swallowing concerns? []  [x]    PEG? []  [x]     Referrals Yes No Comments  Social Work? []  [x]    Dentistry? []  [x]    Swallowing therapy? [x]  []    Nutrition? [x]  []    Med/Onc? []  [x]     Safety Issues Yes No Comments  Prior radiation? []  [x]    Pacemaker/ICD? []  [x]    Possible current pregnancy? []  [x]  N/A  Is the patient on methotrexate? []  [x]     Tobacco/Marijuana/Snuff/ETOH use: Quit smoking in 1996; patient denies any current alcohol consumption or illicit drug use  Past/Anticipated interventions by otolaryngology, if any:  03/23/2020 Dr. Leta Baptist MicroDirect laryngoscopy with excision of vocal cord mass  Past/Anticipated interventions by medical oncology, if any:  No referral placed at this time   Current Complaints / other details:  Patient has received both Pfizer vaccines. He is originally from [place] but moved to [place] to be closer to his children and grandchildren

## 2020-04-05 NOTE — Progress Notes (Signed)
Oncology Nurse Navigator Documentation  Placed introductory call to new referral patient .....  Introduced myself as the H&N oncology nurse navigator that works with Dr. Isidore Moos to whom he has been referred by Dr. Benjamine Mola.  He confirmed understanding of referral.  Briefly explained my role as his navigator, provided my contact information.   Confirmed understanding of upcoming appts and [place] location, explained arrival and registration process.  I encouraged him to call with questions/concerns as he moves forward with appts and procedures.    He verbalized understanding of information provided, expressed appreciation for my call.   Navigator Initial Assessment-  . Employment Status: . Currently on FMLA / STD: . Living Situation: . Support System: . PCP: . PCD: . Financial Concerns: . Transportation Needs: no . Sensory Deficits: . Language Barriers/Interpreter Needed:  no . Ambulation Needs: no . DME Used in Home: no . Psychosocial Needs:  no . Concerns/Needs Understanding Cancer:  addressed/answered by navigator to best of ability . Self-Expressed Needs: no  *Jesse Sharp was driving when I called him. He was unable to talk for long. He will be seeing Dr. Isidore Moos tomorrow for consult and I will join them to continue my assessment.    Harlow Asa RN, BSN, OCN Head & Neck Oncology Nurse [place] at [facility] Phone # [phone]  Fax # [phone]

## 2020-04-06 ENCOUNTER — Other Ambulatory Visit: Payer: Self-pay

## 2020-04-06 ENCOUNTER — Telehealth: Payer: Self-pay | Admitting: *Deleted

## 2020-04-06 ENCOUNTER — Ambulatory Visit
Admission: RE | Admit: 2020-04-06 | Discharge: 2020-04-06 | Disposition: A | Discharge status: Home / Self Care | Payer: Medicare Other | Source: Ambulatory Visit | Attending: Radiation Oncology | Admitting: Radiation Oncology

## 2020-04-06 ENCOUNTER — Encounter: Payer: Self-pay | Admitting: Radiation Oncology

## 2020-04-06 VITALS — BP 109/80 | HR 84 | Temp 97.6°F | Resp 18 | Ht 76.0 in | Wt 244.4 lb

## 2020-04-06 DIAGNOSIS — C320 Malignant neoplasm of glottis: Secondary | ICD-10-CM | POA: Diagnosis not present

## 2020-04-06 DIAGNOSIS — I10 Essential (primary) hypertension: Secondary | ICD-10-CM | POA: Diagnosis not present

## 2020-04-06 DIAGNOSIS — F419 Anxiety disorder, unspecified: Secondary | ICD-10-CM | POA: Insufficient documentation

## 2020-04-06 DIAGNOSIS — Z87891 Personal history of nicotine dependence: Secondary | ICD-10-CM | POA: Insufficient documentation

## 2020-04-06 DIAGNOSIS — Z79899 Other long term (current) drug therapy: Secondary | ICD-10-CM | POA: Diagnosis not present

## 2020-04-06 NOTE — Progress Notes (Signed)
Radiation Oncology         [phone] ________________________________  Initial Outpatient Consultation  Name: Jesse Sharp MRN: [id number]  Date: 04/06/2020  DOB: May 23, 1945  [id number], Larey Dresser, MD  Leta Baptist, MD   REFERRING PHYSICIAN: Leta Baptist, MD  DIAGNOSIS:    ICD-10-CM   1. Squamous cell carcinoma of vocal cord (HCC)  C32.0    Cancer Staging Malignant neoplasm of glottis ([place]) Staging form: Larynx - Glottis, AJCC 8th Edition - Clinical stage from 04/06/2020: Stage I (cT1b, cN0, cM0) - Signed by Eppie Gibson, MD on 04/07/2020   CHIEF COMPLAINT: Here to discuss management of vocal cord cancer  HISTORY OF PRESENT ILLNESS::Jesse Sharp is a 74 y.o. male who presented with hoarseness beginning in June of this year. An upper endoscopy was performed in October, which did not show any suspicious mass or lesion.  Subsequently, the patient saw Dr. Benjamine Mola, who noted an anterior vocal cord soft tissue mass. A MicroDirect laryngoscopy with excision the vocal cord mass was performed on 03/23/2020. Pathology from the procedure revealed squamous cell carcinoma.  No pertinent imaging has been done thus far.  Swallowing issues, if any: no  Weight Changes: no  Pain status: no significant pain  Other symptoms: emotional distress, moderate, related to diagnosis  Tobacco history, if any: Quit smoking in 1996  ETOH abuse, if any: none currently  Prior cancers, if any: None   PREVIOUS RADIATION THERAPY: No  PAST MEDICAL HISTORY:  has a past medical history of Anxiety and Hypertension.    PAST SURGICAL HISTORY: Past Surgical History:  Procedure Laterality Date  . CYST EXCISION    . EYE SURGERY    . HEMORRHOID SURGERY    . MICROLARYNGOSCOPY N/A 03/23/2020   Procedure: MICRO LARYNGOSCOPY WITH EXCISION OF VOCAL CORD NODULE;  Surgeon: Leta Baptist, MD;  Location: [facility];  Service: ENT;  Laterality: N/A;  . PILONIDAL CYST EXCISION    . SPINE SURGERY       FAMILY HISTORY: family history is not on file.  SOCIAL HISTORY:  reports that he has quit smoking. He has never used smokeless tobacco. He reports previous alcohol use. He reports that he does not use drugs.  ALLERGIES: Patient has no known allergies.  MEDICATIONS:  Current Outpatient Medications  Medication Sig Dispense Refill  . ALPRAZolam (XANAX) 0.5 MG tablet Take 0.5 mg by mouth at bedtime as needed for anxiety.    Marland Kitchen losartan (COZAAR) 25 MG tablet Take 12.5 mg by mouth daily.    Marland Kitchen omeprazole (PRILOSEC) 40 MG capsule Take 40 mg by mouth 2 (two) times daily.    . simvastatin (ZOCOR) 20 MG tablet Take 20 mg by mouth daily.    Marland Kitchen triamcinolone cream (KENALOG) 0.1 % Apply 1 application topically 2 (two) times daily. 30 g 0   No current facility-administered medications for this encounter.    REVIEW OF SYSTEMS:  Notable for that above.   PHYSICAL EXAM:  height is 6\' 4"  (1.93 m) and weight is 244 lb 6 oz (110.8 kg). His temporal temperature is 97.6 F (36.4 C). His blood pressure is 109/80 and his pulse is 84. His respiration is 18 and oxygen saturation is 98%.   General: Alert and oriented, in no acute distress; hoarseness is mild HEENT: Head is normocephalic. Extraocular movements are intact. Oropharynx is notable for no lesions; no oral lesions. Neck: Neck is notable for no palpable masses Heart: Regular in rate and rhythm with no murmurs, rubs,  or gallops. Chest: Clear to auscultation bilaterally, with no rhonchi, wheezes, or rales. Abdomen: Soft, nontender, nondistended, with no rigidity or guarding. Extremities: No cyanosis or edema. Lymphatics: see Neck Exam Skin: No concerning lesions. Musculoskeletal: symmetric strength and muscle tone throughout. Neurologic: Cranial nerves II through XII are grossly intact. No obvious focalities. Speech is fluent. Coordination is intact. Psychiatric: Judgment and insight are intact. Affect is appropriate.   ECOG = 0  0 -  Asymptomatic (Fully active, able to carry on all predisease activities without restriction)  1 - Symptomatic but completely ambulatory (Restricted in physically strenuous activity but ambulatory and able to carry out work of a light or sedentary nature. For example, light housework, office work)  2 - Symptomatic, <50% in bed during the day (Ambulatory and capable of all self care but unable to carry out any work activities. Up and about more than 50% of waking hours)  3 - Symptomatic, >50% in bed, but not bedbound (Capable of only limited self-care, confined to bed or chair 50% or more of waking hours)  4 - Bedbound (Completely disabled. Cannot carry on any self-care. Totally confined to bed or chair)  5 - Death   Eustace Pen MM, Creech RH, Tormey DC, et al. [id number]). "Toxicity and response criteria of the [facility]". [place] Oncol. 5 (6): 649-55   LABORATORY DATA:  No results found for: WBC, HGB, HCT, MCV, PLT CMP  No results found for: NA, K, CL, CO2, GLUCOSE, BUN, CREATININE, CALCIUM, PROT, ALBUMIN, AST, ALT, ALKPHOS, BILITOT, GFRNONAA, GFRAA    No results found for: TSH   RADIOGRAPHY: No results found.    IMPRESSION/PLAN:  This is a delightful patient with head and neck cancer.  Initially during our discussion, he expressed he did not want to consider radiotherapy based on the initial logistics and side effects we discussed. I offered a referral to [facility]NT to discuss surgical options for cure. He is not interested in surgery, either. We also discussed close observation with ENT but I recommend he strongly consider oncologic clearance of margins (limited surgery, as I discussed with ENT, which would likely require supracricoid laryngectomy) or radiation therapy.    He then expressed he would consider radiation strongly. He declines an ENT referral to [facility].  I recommend a six week course of hypofractionated radiotherapy for this patient; he is concerned about this  length of treatment and asked if a 2 week course were available.  I stated this would not be appropriate, but acknowledged that a less standard but acceptable option is a 4 week hypofractionated course (higher dose/day than 6 weeks) which has yielded excellent results, as well, in my experience.   He will think about these options.  We discussed the potential risks, benefits, and side effects of radiotherapy. We talked in detail about acute and late effects. We discussed that some of the most bothersome acute effects may be mucositis, dysgeusia, salivary changes, skin irritation, hair loss, dehydration, weight loss and fatigue. We talked about late effects which include but are not necessarily limited to dysphagia, hypothyroidism, nerve injury, potential need for a feeding tube, vascular injury, voice box damage requiring surgery, potential injury to any of the tissues in the head and neck region. No guarantees of treatment were given. A consent form was signed and placed in the patient's medical record. The patient is leaning towards proceeding with treatment. I look forward to participating in the patient's care.    Unfortunately, laryngoscopy nasal spray was  not obtainable from our pharmacy today.  Pt would like to be scoped again to verify there is not new gross disease since bx.  We will obtain a CT neck and I will see him soon after to give the results, and scope him that day.  Simulation can take place that day, too.  We also discussed that the treatment of head and neck cancer is a multidisciplinary process to maximize treatment outcomes and quality of life. For this reason the following referrals have been or will be made:   Nutritionist for nutrition support during and after treatment.   Speech language pathology for swallowing and/or speech therapy.   Social work for social support.    Baseline labs including TSH.   We discussed measures to reduce the risk of infection during the COVID-19  pandemic. He is due for his booster. After a discussion, he would like to proceed. Anderson Malta Malmfelt will schedule his booster at the [facility].  On date of service, in total, I spent 65 minutes on this encounter. Patient was seen in person.  __________________________________________   Eppie Gibson, MD  This document serves as a record of services personally performed by Eppie Gibson, MD. It was created on his behalf by Clerance Lav, a trained medical scribe. The creation of this record is based on the scribe's personal observations and the provider's statements to them. This document has been checked and approved by the attending provider.

## 2020-04-06 NOTE — Progress Notes (Signed)
Oncology Nurse Navigator Documentation  Met with patient during initial consult with Dr. Isidore Moos.  . Further introduced myself as his/their Navigator, explained my role as a member of the Care Team. . Provided New Patient Information packet: o Contact information for physician, this navigator, other members of the Care Team o Advance Directive information ([place] blue pamphlet with LCSW insert); provided [facility] AD booklet at his request,  o Fall Prevention Patient [place] sheet o Symptom Management Clinic information o [facility] campus map with highlight of [place] o SLP Information sheet . Assisted with post-consult appt scheduling. He has been scheduled for 12/9 CT neck and 12/10 appointment with Dr. Isidore Moos and [place]. He is aware of these appointments.  Marland Kitchen He verbalized understanding of information provided. . I encouraged them to call with questions/concerns moving forward.   Navigator Assessment . Employment Status: He is working part time . Currently on FMLA / STD: no . Living Situation: He lives alone. . Support System: He has 3 children who are able to provide some support.  Marland Kitchen PCP: Latanya Presser . PCD: . Financial Concerns: no . Transportation Needs: no . Sensory Deficits: no . Language Barriers/Interpreter Needed:  no . Ambulation Needs: no . DME Used in Home: no . Psychosocial Needs:  no . Concerns/Needs Understanding Cancer:  addressed/answered by navigator to best of ability . Self-Expressed Needs: no   Harlow Asa RN, BSN, OCN Head & Neck Oncology Nurse [place] at [facility] Phone # [phone]  Fax # [phone]

## 2020-04-06 NOTE — Telephone Encounter (Signed)
Called patient to inform of lab on 04-15-20 @ 11:45 am @ [facility] and his CT on 04-15-20 - arrival time- 12:45 pm @ [facility], patient to have water only -4 hrs. prior to test, then patient will be seen by Dr. Isidore Moos on 04-16-20 - arrival time- 7:20 am @ [facility],, nurse- 7:30 am, Dr. Isidore Moos - 8 am and sim to follow @ 9 am, spoke with patient and he is aware of these appts.

## 2020-04-07 ENCOUNTER — Other Ambulatory Visit: Payer: Self-pay

## 2020-04-07 ENCOUNTER — Encounter: Payer: Self-pay | Admitting: Radiation Oncology

## 2020-04-07 ENCOUNTER — Encounter: Payer: Self-pay | Admitting: General Practice

## 2020-04-07 DIAGNOSIS — C320 Malignant neoplasm of glottis: Secondary | ICD-10-CM | POA: Insufficient documentation

## 2020-04-07 NOTE — Progress Notes (Signed)
[place] CSW Progress Notes  Call to patient per referral from Dr Isidore Moos for new head and neck cancer patient.  Unable to reach him, left VM w my contact information and encouragement to call back at his convenience.  Edwyna Shell, LCSW Clinical Social Worker Phone:  [phone]

## 2020-04-08 ENCOUNTER — Encounter: Payer: Self-pay | Admitting: General Practice

## 2020-04-08 NOTE — Progress Notes (Signed)
[place] Psychosocial Distress Screening Clinical Social Work  Clinical Social Work was referred by distress screening protocol.  The patient scored a 0 on the Psychosocial Distress Thermometer which indicates none distress. Clinical Social Worker contacted patient by phone to assess for distress and other psychosocial needs.  Second call to patient, no answer, left VM.  Closing referral as patient's screen indicates no distress and declines social work referral.  Can see in Head and Neck MDC if needed or by referral.    ONCBCN DISTRESS SCREENING 04/06/2020  Screening Type Initial Screening  Distress experienced in past week (1-10) 0  Emotional problem type Adjusting to illness  Information Concerns Type Lack of info about diagnosis;Lack of info about treatment;Lack of info about complementary therapy choices  Physician notified of physical symptoms No  Referral to clinical psychology No  Referral to clinical social work No  Referral to dietition No  Referral to financial advocate No  Referral to support programs No  Referral to palliative care No    Clinical Social Worker follow up needed: No.  If yes, follow up plan:  Beverely Pace, [place], LCSW Clinical Social Worker Phone:  [phone]

## 2020-04-13 DIAGNOSIS — M9903 Segmental and somatic dysfunction of lumbar region: Secondary | ICD-10-CM | POA: Diagnosis not present

## 2020-04-13 DIAGNOSIS — M5116 Intervertebral disc disorders with radiculopathy, lumbar region: Secondary | ICD-10-CM | POA: Diagnosis not present

## 2020-04-14 ENCOUNTER — Telehealth: Payer: Self-pay | Admitting: Radiation Oncology

## 2020-04-14 NOTE — Telephone Encounter (Signed)
Pt called to confirm all his appts for tomorrow, 12/8 and 12/9. I have left our nurse Egbert Garibaldi and Romie Jumper know.

## 2020-04-15 ENCOUNTER — Ambulatory Visit (HOSPITAL_COMMUNITY)
Admission: RE | Admit: 2020-04-15 | Discharge: 2020-04-15 | Disposition: A | Discharge status: Home / Self Care | Payer: Medicare Other | Source: Ambulatory Visit | Attending: Radiation Oncology | Admitting: Radiation Oncology

## 2020-04-15 ENCOUNTER — Other Ambulatory Visit: Payer: Self-pay

## 2020-04-15 ENCOUNTER — Ambulatory Visit
Admission: RE | Admit: 2020-04-15 | Discharge: 2020-04-15 | Disposition: A | Discharge status: Home / Self Care | Payer: Medicare Other | Source: Ambulatory Visit | Attending: Radiation Oncology | Admitting: Radiation Oncology

## 2020-04-15 DIAGNOSIS — I6529 Occlusion and stenosis of unspecified carotid artery: Secondary | ICD-10-CM | POA: Diagnosis not present

## 2020-04-15 DIAGNOSIS — Z510 Encounter for antineoplastic radiation therapy: Secondary | ICD-10-CM | POA: Insufficient documentation

## 2020-04-15 DIAGNOSIS — R490 Dysphonia: Secondary | ICD-10-CM | POA: Diagnosis not present

## 2020-04-15 DIAGNOSIS — C320 Malignant neoplasm of glottis: Secondary | ICD-10-CM | POA: Insufficient documentation

## 2020-04-15 DIAGNOSIS — C4492 Squamous cell carcinoma of skin, unspecified: Secondary | ICD-10-CM | POA: Diagnosis not present

## 2020-04-15 LAB — CBC (CANCER CENTER ONLY)
HCT: 45.7 % (ref 39.0–52.0)
Hemoglobin: 15.6 g/dL (ref 13.0–17.0)
MCH: 30.2 pg (ref 26.0–34.0)
MCHC: 34.1 g/dL (ref 30.0–36.0)
MCV: 88.4 fL (ref 80.0–100.0)
Platelet Count: 210 10*3/uL (ref 150–400)
RBC: 5.17 MIL/uL (ref 4.22–5.81)
RDW: 12.3 % (ref 11.5–15.5)
WBC Count: 6 10*3/uL (ref 4.0–10.5)
nRBC: 0 % (ref 0.0–0.2)

## 2020-04-15 LAB — TSH: TSH: 0.686 u[IU]/mL (ref 0.320–4.118)

## 2020-04-15 LAB — BASIC METABOLIC PANEL - CANCER CENTER ONLY
Anion gap: 8 (ref 5–15)
BUN: 13 mg/dL (ref 8–23)
CO2: 27 mmol/L (ref 22–32)
Calcium: 10.2 mg/dL (ref 8.9–10.3)
Chloride: 104 mmol/L (ref 98–111)
Creatinine: 0.94 mg/dL (ref 0.61–1.24)
GFR, Estimated: >60 mL/min (ref >60)
Glucose, Bld: 92 mg/dL (ref 70–99)
Potassium: 4.3 mmol/L (ref 3.5–5.1)
Sodium: 139 mmol/L (ref 135–145)

## 2020-04-15 MED ORDER — IOHEXOL 300 MG/ML  SOLN
75.0000 mL | Freq: Once | INTRAMUSCULAR | Status: AC | PRN
  Administered 2020-04-15: 75 mL via INTRAVENOUS

## 2020-04-16 ENCOUNTER — Inpatient Hospital Stay: Payer: Medicare Other | Attending: Radiation Oncology

## 2020-04-16 ENCOUNTER — Ambulatory Visit
Admission: RE | Admit: 2020-04-16 | Discharge: 2020-04-16 | Disposition: A | Discharge status: Home / Self Care | Payer: Medicare Other | Source: Ambulatory Visit | Attending: Radiation Oncology | Admitting: Radiation Oncology

## 2020-04-16 ENCOUNTER — Other Ambulatory Visit: Payer: Self-pay

## 2020-04-16 ENCOUNTER — Ambulatory Visit: Payer: Self-pay | Admitting: Radiation Oncology

## 2020-04-16 VITALS — BP 124/79 | HR 90 | Temp 98.0°F | Resp 17 | Wt 243.2 lb

## 2020-04-16 DIAGNOSIS — C320 Malignant neoplasm of glottis: Secondary | ICD-10-CM

## 2020-04-16 DIAGNOSIS — Z510 Encounter for antineoplastic radiation therapy: Secondary | ICD-10-CM | POA: Diagnosis not present

## 2020-04-16 DIAGNOSIS — Z87891 Personal history of nicotine dependence: Secondary | ICD-10-CM | POA: Diagnosis not present

## 2020-04-16 DIAGNOSIS — Z23 Encounter for immunization: Secondary | ICD-10-CM | POA: Diagnosis not present

## 2020-04-16 MED ORDER — LIDOCAINE VISCOUS HCL 2 % MT SOLN: OROMUCOSAL | 3 refills | Status: DC

## 2020-04-16 MED ORDER — LARYNGOSCOPY SOLUTION RAD-ONC
15.0000 mL | Freq: Once | TOPICAL | Status: AC
  Administered 2020-04-16: 15 mL via TOPICAL
  Filled 2020-04-16: qty 15

## 2020-04-16 NOTE — Progress Notes (Signed)
   Covid-19 Vaccination Clinic  Name:  Jesse Sharp    MRN: [id number] DOB: January 16, 1946  04/16/2020  Mr. [name] was observed post Covid-19 immunization for 15 minutes without incident. He was provided with Vaccine Information Sheet and instruction to access the V-Safe system.   Mr. [name] was instructed to call 911 with any severe reactions post vaccine: Marland Kitchen Difficulty breathing  . Swelling of face and throat  . A fast heartbeat  . A bad rash all over body  . Dizziness and weakness   Immunizations Administered    Name Date Dose VIS Date Route   Pfizer COVID-19 Vaccine 04/16/2020  9:56 AM 0.3 mL 02/25/2020 Intramuscular   Manufacturer: [place]   Lot: [id number]   [place]: [id number]

## 2020-04-16 NOTE — Progress Notes (Signed)
Radiation Oncology         [id number]) [phone] ________________________________  Name: Jesse Sharp MRN: [id number]  Date: 04/16/2020  DOB: Mar 11, 1946  Follow-Up Visit Note  Outpatient  CC: Bakare, Larey Dresser, MD  Bakare, Mobolaji B, MD  Diagnosis:      ICD-10-CM   1. Malignant neoplasm of glottis ([place])  C32.0 laryngocopy solution for Rad-Onc    Fiberoptic laryngoscopy     CHIEF COMPLAINT: Here to discuss management of vocal cord cancer  Narrative:  The patient returns today for follow-up. He was seen in consultation on 04/06/2020, and after much discussion, he stated that he would consider radiation therapy. I also offered him a referral to [facility] to discuss surgical options for cure, which he declined.    Since consultation date, he underwent the following imaging (dates and results as follows):  1. Soft tissue neck CT scan on 04/15/2020 showed only subtle asymmetry and enhancement at the left true cord. There was no other laryngeal mass, nor was there any lymphadenopathy or metastatic disease identified in the  neck.  He presents today for CT simulation, however before that, we will perform laryngoscopy.  Symptomatically, he is stable.        ALLERGIES:  has No Known Allergies.  Meds: Current Outpatient Medications  Medication Sig Dispense Refill  . ALPRAZolam (XANAX) 0.5 MG tablet Take 0.5 mg by mouth at bedtime as needed for anxiety.    Marland Kitchen losartan (COZAAR) 25 MG tablet Take 12.5 mg by mouth daily.    Marland Kitchen omeprazole (PRILOSEC) 40 MG capsule Take 40 mg by mouth 2 (two) times daily.    . simvastatin (ZOCOR) 20 MG tablet Take 20 mg by mouth daily.    Marland Kitchen triamcinolone cream (KENALOG) 0.1 % Apply 1 application topically 2 (two) times daily. 30 g 0   No current facility-administered medications for this encounter.    Physical Findings:  weight is 243 lb 4 oz (110.3 kg). His oral temperature is 98 F (36.7 C). His blood pressure is 124/79 and his pulse is 90. His respiration is  17 and oxygen saturation is 99%. .     General: Alert and oriented, in no acute distress Neurologic: No obvious focalities. Speech is fluent.  Psychiatric: Judgment and insight are intact. Affect is appropriate.   PROCEDURE NOTE: After obtaining consent and anesthetizing the nasal cavity with topical lidocaine and phenylephrine, the flexible endoscope was introduced and passed through the nasal cavity.  The nasopharynx, oropharynx, larynx, and hypopharynx were then examined.  No lesions visualized.  The true cords are symmetrically mobile.  Status post biopsy at the anterior commissure. Left true cord is more erythematous than the right.  No nodularity or leukoplakia of the true cords.   Lab Findings: Lab Results  Component Value Date   WBC 6.0 04/15/2020   HGB 15.6 04/15/2020   HCT 45.7 04/15/2020   MCV 88.4 04/15/2020   PLT 210 04/15/2020     Radiographic Findings: CT Soft Tissue Neck W Contrast  Result Date: 04/15/2020 CLINICAL DATA:  74 year old male with hoarseness status post endoscopic excision of vocal cord mass positive for squamous cell carcinoma. Staging. EXAM: CT NECK WITH CONTRAST TECHNIQUE: Multidetector CT imaging of the neck was performed using the standard protocol following the bolus administration of intravenous contrast. CONTRAST:  67mL OMNIPAQUE IOHEXOL 300 MG/ML  SOLN COMPARISON:  None. FINDINGS: Pharynx and larynx: There is only minimal soft tissue asymmetry of the larynx including the left true cord (series 3, image  85 and coronal image 47). Questionable subcentimeter focus of hyperenhancement at the left true cord (coronal image 45). Anterior commissure and right true cord appear within normal limits. Epiglottis and pharyngeal soft tissue contours are within normal limits. Negative parapharyngeal and retropharyngeal spaces. Salivary glands: Negative sublingual space. Submandibular glands and parotid glands are within normal limits. Thyroid: Negative for age. Lymph  nodes: No cervical lymphadenopathy. Symmetric, diminutive bilateral cervical lymph nodes. Vascular: Major vascular structures in the neck and at the skull base are patent. Mix of soft and calcified plaque at the carotid bifurcations. Calcified atherosclerosis at the skull base. Limited intracranial: Negative. Visualized orbits: Postoperative changes to both globes, otherwise negative. Mastoids and visualized paranasal sinuses: Paranasal sinuses are clear. Tympanic cavities are clear. Mild posteroinferior right mastoid opacification, fusion. Skeleton: Mild for age cervical spine degeneration. No acute or suspicious osseous lesion. Upper chest: Calcified aortic atherosclerosis. No superior mediastinal lymphadenopathy. Mild respiratory motion with otherwise negative visible upper lungs. IMPRESSION: 1. Only subtle asymmetry and enhancement at the left true cord. No other laryngeal mass. No lymphadenopathy or metastatic disease identified in the neck. 2. Aortic Atherosclerosis (ICD10-I70.0). Electronically Signed   By: Genevie Ann M.D.   On: 04/15/2020 22:23    Impression/Plan: This is a very nice 74 year old gentleman with history of stage I glottic cancer  I personally reviewed his imaging.  No obvious evidence of residual disease  Fiberoptic laryngoscopy today demonstrates: no obvious evidence of residual disease  I again explained to the patient that the purpose of radiation therapy is to prevent local recurrences as there is a significant risk of residual microscopic carcinoma at the site of biopsy.  He is not interested in further surgery.  He understands that he could undergo close surveillance with otolaryngology in case there is a recurrence (still understanding that his risk of recurrence is much higher with observation than with radiation therapy). He would like to proceed with treatment and strongly prefers the 4-week hypofractionated regimen which would be to a dose of 55 Gray in 20 fractions.  We will  proceed accordingly.  Today he will undergo CT simulation and we will start his treatment on December 20.  He is pleased with this plan.  All questions were answered to his satisfaction.  No guarantees of treatment were given.  Consent form has been signed and placed in his chart.  He has a good understanding of the risks benefits and side effects of treatment.  I look forward to participating in his care.  On date of service, in total, I spent 30 minutes on this encounter. Patient was seen in person.  _____________________________________   Eppie Gibson, MD  This document serves as a record of services personally performed by Eppie Gibson, MD. It was created on his behalf by Clerance Lav, a trained medical scribe. The creation of this record is based on the scribe's personal observations and the provider's statements to them. This document has been checked and approved by the attending provider.

## 2020-04-19 ENCOUNTER — Encounter: Payer: Self-pay | Admitting: Radiation Oncology

## 2020-04-21 DIAGNOSIS — C320 Malignant neoplasm of glottis: Secondary | ICD-10-CM | POA: Diagnosis not present

## 2020-04-21 DIAGNOSIS — Z510 Encounter for antineoplastic radiation therapy: Secondary | ICD-10-CM | POA: Diagnosis not present

## 2020-04-21 DIAGNOSIS — Z87891 Personal history of nicotine dependence: Secondary | ICD-10-CM | POA: Diagnosis not present

## 2020-04-26 ENCOUNTER — Ambulatory Visit
Admission: RE | Admit: 2020-04-26 | Discharge: 2020-04-26 | Disposition: A | Discharge status: Home / Self Care | Payer: Medicare Other | Source: Ambulatory Visit | Attending: Radiation Oncology | Admitting: Radiation Oncology

## 2020-04-26 ENCOUNTER — Other Ambulatory Visit: Payer: Self-pay

## 2020-04-26 DIAGNOSIS — C320 Malignant neoplasm of glottis: Secondary | ICD-10-CM | POA: Diagnosis not present

## 2020-04-26 DIAGNOSIS — Z87891 Personal history of nicotine dependence: Secondary | ICD-10-CM | POA: Diagnosis not present

## 2020-04-26 DIAGNOSIS — Z510 Encounter for antineoplastic radiation therapy: Secondary | ICD-10-CM | POA: Diagnosis not present

## 2020-04-26 MED ORDER — SONAFINE EX EMUL
1.0000 "application " | Freq: Two times a day (BID) | CUTANEOUS | Status: DC
  Administered 2020-04-26: 1 via TOPICAL

## 2020-04-26 NOTE — Progress Notes (Signed)
Pt here for patient teaching.  Pt given Radiation and You booklet, Managing Acute Radiation Side Effects for Head and Neck Cancer handout, skin care instructions and Sonafine.  Reviewed areas of pertinence such as fatigue, hair loss, mouth changes, skin changes, throat changes, earaches and taste changes . Pt able to give teach back of to pat skin, use unscented/gentle soap and drink plenty of water,apply Sonafine bid and avoid applying anything to skin within 4 hours of treatment. Pt demonstrated understanding, of information given and will contact nursing with any questions or concerns.     Http://rtanswers.org/treatmentinformation/whattoexpect/index

## 2020-04-26 NOTE — Progress Notes (Signed)
Oncology Nurse Navigator Documentation  To provide support, encouragement and care continuity, met with Mr. Jesse Sharp for his initial RT.  .  I reviewed the 2-step treatment process, answered questions.   Jesse Sharp completed treatment without difficulty, denied questions/concerns.  I reviewed the registration/arrival procedure for subsequent treatments.  I encouraged him to call me with questions/concerns as tmts proceed.   Harlow Asa RN, BSN, OCN Head & Neck Oncology Nurse [place] at [facility] Phone # [phone]  Fax # [phone]

## 2020-04-27 ENCOUNTER — Ambulatory Visit
Admission: RE | Admit: 2020-04-27 | Discharge: 2020-04-27 | Disposition: A | Discharge status: Home / Self Care | Payer: Medicare Other | Source: Ambulatory Visit | Attending: Radiation Oncology | Admitting: Radiation Oncology

## 2020-04-27 ENCOUNTER — Ambulatory Visit: Payer: Medicare Other | Admitting: Radiation Oncology

## 2020-04-27 DIAGNOSIS — Z510 Encounter for antineoplastic radiation therapy: Secondary | ICD-10-CM | POA: Diagnosis not present

## 2020-04-27 DIAGNOSIS — C320 Malignant neoplasm of glottis: Secondary | ICD-10-CM | POA: Diagnosis not present

## 2020-04-27 DIAGNOSIS — Z87891 Personal history of nicotine dependence: Secondary | ICD-10-CM | POA: Diagnosis not present

## 2020-04-28 ENCOUNTER — Ambulatory Visit
Admission: RE | Admit: 2020-04-28 | Discharge: 2020-04-28 | Disposition: A | Discharge status: Home / Self Care | Payer: Medicare Other | Source: Ambulatory Visit | Attending: Radiation Oncology | Admitting: Radiation Oncology

## 2020-04-28 DIAGNOSIS — Z510 Encounter for antineoplastic radiation therapy: Secondary | ICD-10-CM | POA: Diagnosis not present

## 2020-04-28 DIAGNOSIS — Z87891 Personal history of nicotine dependence: Secondary | ICD-10-CM | POA: Diagnosis not present

## 2020-04-28 DIAGNOSIS — C320 Malignant neoplasm of glottis: Secondary | ICD-10-CM | POA: Diagnosis not present

## 2020-04-29 ENCOUNTER — Ambulatory Visit
Admission: RE | Admit: 2020-04-29 | Discharge: 2020-04-29 | Disposition: A | Discharge status: Home / Self Care | Payer: Medicare Other | Source: Ambulatory Visit | Attending: Radiation Oncology | Admitting: Radiation Oncology

## 2020-04-29 ENCOUNTER — Other Ambulatory Visit: Payer: Self-pay

## 2020-04-29 DIAGNOSIS — C320 Malignant neoplasm of glottis: Secondary | ICD-10-CM | POA: Diagnosis not present

## 2020-04-29 DIAGNOSIS — Z510 Encounter for antineoplastic radiation therapy: Secondary | ICD-10-CM | POA: Diagnosis not present

## 2020-04-29 DIAGNOSIS — Z87891 Personal history of nicotine dependence: Secondary | ICD-10-CM | POA: Diagnosis not present

## 2020-05-03 ENCOUNTER — Ambulatory Visit
Admission: RE | Admit: 2020-05-03 | Discharge: 2020-05-03 | Disposition: A | Discharge status: Home / Self Care | Payer: Medicare Other | Source: Ambulatory Visit | Attending: Radiation Oncology | Admitting: Radiation Oncology

## 2020-05-03 DIAGNOSIS — Z510 Encounter for antineoplastic radiation therapy: Secondary | ICD-10-CM | POA: Diagnosis not present

## 2020-05-03 DIAGNOSIS — Z87891 Personal history of nicotine dependence: Secondary | ICD-10-CM | POA: Diagnosis not present

## 2020-05-03 DIAGNOSIS — C320 Malignant neoplasm of glottis: Secondary | ICD-10-CM | POA: Diagnosis not present

## 2020-05-04 ENCOUNTER — Ambulatory Visit
Admission: RE | Admit: 2020-05-04 | Discharge: 2020-05-04 | Disposition: A | Discharge status: Home / Self Care | Payer: Medicare Other | Source: Ambulatory Visit | Attending: Radiation Oncology | Admitting: Radiation Oncology

## 2020-05-04 DIAGNOSIS — Z87891 Personal history of nicotine dependence: Secondary | ICD-10-CM | POA: Diagnosis not present

## 2020-05-04 DIAGNOSIS — C320 Malignant neoplasm of glottis: Secondary | ICD-10-CM | POA: Diagnosis not present

## 2020-05-04 DIAGNOSIS — M9903 Segmental and somatic dysfunction of lumbar region: Secondary | ICD-10-CM | POA: Diagnosis not present

## 2020-05-04 DIAGNOSIS — Z510 Encounter for antineoplastic radiation therapy: Secondary | ICD-10-CM | POA: Diagnosis not present

## 2020-05-04 DIAGNOSIS — M5116 Intervertebral disc disorders with radiculopathy, lumbar region: Secondary | ICD-10-CM | POA: Diagnosis not present

## 2020-05-05 ENCOUNTER — Ambulatory Visit
Admission: RE | Admit: 2020-05-05 | Discharge: 2020-05-05 | Disposition: A | Discharge status: Home / Self Care | Payer: Medicare Other | Source: Ambulatory Visit | Attending: Radiation Oncology | Admitting: Radiation Oncology

## 2020-05-05 DIAGNOSIS — Z510 Encounter for antineoplastic radiation therapy: Secondary | ICD-10-CM | POA: Diagnosis not present

## 2020-05-05 DIAGNOSIS — Z87891 Personal history of nicotine dependence: Secondary | ICD-10-CM | POA: Diagnosis not present

## 2020-05-05 DIAGNOSIS — C320 Malignant neoplasm of glottis: Secondary | ICD-10-CM | POA: Diagnosis not present

## 2020-05-06 ENCOUNTER — Ambulatory Visit
Admission: RE | Admit: 2020-05-06 | Discharge: 2020-05-06 | Disposition: A | Discharge status: Home / Self Care | Payer: Medicare Other | Source: Ambulatory Visit | Attending: Radiation Oncology | Admitting: Radiation Oncology

## 2020-05-06 ENCOUNTER — Other Ambulatory Visit: Payer: Self-pay

## 2020-05-06 DIAGNOSIS — C320 Malignant neoplasm of glottis: Secondary | ICD-10-CM | POA: Diagnosis not present

## 2020-05-06 DIAGNOSIS — Z510 Encounter for antineoplastic radiation therapy: Secondary | ICD-10-CM | POA: Diagnosis not present

## 2020-05-06 DIAGNOSIS — Z87891 Personal history of nicotine dependence: Secondary | ICD-10-CM | POA: Diagnosis not present

## 2020-05-10 ENCOUNTER — Ambulatory Visit
Admission: RE | Admit: 2020-05-10 | Discharge: 2020-05-10 | Disposition: A | Discharge status: Home / Self Care | Payer: Medicare Other | Source: Ambulatory Visit | Attending: Radiation Oncology | Admitting: Radiation Oncology

## 2020-05-10 ENCOUNTER — Other Ambulatory Visit: Payer: Self-pay | Admitting: Radiation Oncology

## 2020-05-10 ENCOUNTER — Other Ambulatory Visit: Payer: Self-pay

## 2020-05-10 DIAGNOSIS — Z87891 Personal history of nicotine dependence: Secondary | ICD-10-CM | POA: Diagnosis not present

## 2020-05-10 DIAGNOSIS — C320 Malignant neoplasm of glottis: Secondary | ICD-10-CM

## 2020-05-10 MED ORDER — HYDROCODONE-ACETAMINOPHEN 7.5-325 MG/15ML PO SOLN: 10.0000 mL | ORAL | 0 refills | Status: DC | PRN

## 2020-05-10 NOTE — Progress Notes (Signed)
Nutrition  Patient was scheduled for nutrition appointment on Tuesday, Jan 4th at [facility] after radiation.  Due to unforeseen circumstances nutrition appointment has been moved to Wed, Jan 12 at [facility]  RD called patient and left voicemail explaining change in nutrition appointment. Left call back number if patient has further questions.   Jaylee Lantry B. Freida Busman, RD, LDN Registered Dietitian [phone] (mobile)

## 2020-05-11 ENCOUNTER — Ambulatory Visit
Admission: RE | Admit: 2020-05-11 | Discharge: 2020-05-11 | Disposition: A | Discharge status: Home / Self Care | Payer: Medicare Other | Source: Ambulatory Visit | Attending: Radiation Oncology | Admitting: Radiation Oncology

## 2020-05-11 ENCOUNTER — Other Ambulatory Visit: Payer: Self-pay

## 2020-05-11 ENCOUNTER — Inpatient Hospital Stay: Payer: Medicare Other

## 2020-05-11 DIAGNOSIS — Z87891 Personal history of nicotine dependence: Secondary | ICD-10-CM | POA: Diagnosis not present

## 2020-05-11 DIAGNOSIS — C320 Malignant neoplasm of glottis: Secondary | ICD-10-CM | POA: Diagnosis not present

## 2020-05-11 NOTE — Progress Notes (Signed)
Oncology Nurse Navigator Documentation  I spoke with Mr. Jesse Sharp briefly today before his radiation treatment. I reminded him of his appointment with Jesse Sharp SLP after his treatment on 05/13/20 as originally ordered by Dr. Basilio Sharp. He informed me that he had other things to do and will not be attending that appointment. I explained the importance of swallowing therapy in relation to receiving radiation to his neck and preventing difficulty swallowing in the future. He voiced that he appreciated my explanation but did not commit to attending the appointment. I will meet with him on 1/6 to see if he will be attending as scheduled.   Hedda Slade RN, BSN, OCN Head & Neck Oncology Nurse Navigator [facility] at [facility] Phone # [phone]  Fax # [phone]

## 2020-05-12 ENCOUNTER — Other Ambulatory Visit: Payer: Self-pay

## 2020-05-12 ENCOUNTER — Ambulatory Visit
Admission: RE | Admit: 2020-05-12 | Discharge: 2020-05-12 | Disposition: A | Discharge status: Home / Self Care | Payer: Medicare Other | Source: Ambulatory Visit | Attending: Radiation Oncology | Admitting: Radiation Oncology

## 2020-05-12 DIAGNOSIS — C320 Malignant neoplasm of glottis: Secondary | ICD-10-CM | POA: Diagnosis not present

## 2020-05-12 DIAGNOSIS — Z87891 Personal history of nicotine dependence: Secondary | ICD-10-CM | POA: Diagnosis not present

## 2020-05-13 ENCOUNTER — Other Ambulatory Visit: Payer: Self-pay

## 2020-05-13 ENCOUNTER — Ambulatory Visit: Payer: Medicare Other | Attending: Radiation Oncology

## 2020-05-13 ENCOUNTER — Ambulatory Visit
Admission: RE | Admit: 2020-05-13 | Discharge: 2020-05-13 | Disposition: A | Discharge status: Home / Self Care | Payer: Medicare Other | Source: Ambulatory Visit | Attending: Radiation Oncology | Admitting: Radiation Oncology

## 2020-05-13 DIAGNOSIS — Z87891 Personal history of nicotine dependence: Secondary | ICD-10-CM | POA: Diagnosis not present

## 2020-05-13 DIAGNOSIS — C320 Malignant neoplasm of glottis: Secondary | ICD-10-CM | POA: Diagnosis not present

## 2020-05-13 DIAGNOSIS — R1310 Dysphagia, unspecified: Secondary | ICD-10-CM | POA: Insufficient documentation

## 2020-05-13 NOTE — Progress Notes (Signed)
Oncology Nurse Navigator Documentation  I met with patient briefly before his meeting with Carl Schinke SLP duing head and neck MDC today. He has requested that I contact our insurance specialist to give him a call to help answer questions about insurance payments. I will do that today. He knows to call me if he has any further questions or concerns.     RN, BSN, OCN Head & Neck Oncology Nurse Navigator [facility] at [facility] Phone # [phone]  Fax # [phone] 

## 2020-05-13 NOTE — Patient Instructions (Signed)
SWALLOWING EXERCISES Do these until 6 months after your last day of radiation, then 2-3 times per week afterwards  1. Effortful Swallows - Press your tongue against the roof of your mouth for 3 seconds, then squeeze the muscles in your neck while you swallow your saliva or a sip of water - Repeat 10-15 times, 2-3 times a day, and use whenever you eat or drink  2. Pitch Raise - Repeat "he", once per second in as high of a pitch as you can - Repeat 20 times, 2-3 times a day  3. "siren" exercise  Start at as low of a pitch as you can with "ah" and glide to as high of a pitch as you can, then back as low as you can. Repeat 15 times twice a day

## 2020-05-13 NOTE — Therapy (Signed)
[facility] [address] [place], [place], [address] Phone: [phone]   Fax:  [phone]  Speech Language Pathology Evaluation  Patient Details  Name: Jesse Sharp MRN: [id number] Date of Birth: 17-Jun-1945 Referring Provider (SLP): Basilio [place], [place]   Encounter Date: 05/13/2020   End of Session - 05/13/20 1447    Visit Number 1    Number of Visits 1    Date for SLP Re-Evaluation 05/13/20    SLP Start Time 0817    SLP Stop Time  0845    SLP Time Calculation (min) 28 min    Activity Tolerance Patient tolerated treatment well           Past Medical History:  Diagnosis Date  . Anxiety   . Hypertension     Past Surgical History:  Procedure Laterality Date  . CYST EXCISION    . EYE SURGERY    . HEMORRHOID SURGERY    . MICROLARYNGOSCOPY N/A 03/23/2020   Procedure: MICRO LARYNGOSCOPY WITH EXCISION OF VOCAL CORD NODULE;  Surgeon: Newman Pies, MD;  Location: [facility];  Service: ENT;  Laterality: N/A;  . PILONIDAL CYST EXCISION    . SPINE SURGERY      There were no vitals filed for this visit.   Subjective Assessment - 05/13/20 0848    Subjective "I have to do these (HEP)  because I don't want the muscles to get fibrotic."              SLP Evaluation [facility] - 05/13/20 1438      SLP Visit Information   SLP Received On 05/13/20    Referring Provider (SLP) Basilio Cairo, Sarah,MD    Onset Date june 2021    Medical Diagnosis SCCA of vocal cord      Subjective   Subjective Pt reports pain today approx 8/10 (10=worst pain) in throat, worse with swallowing.    Patient/Family Stated Goal "Keep swallowing"      General Information   HPI Presented to PCP June with hoarseness. Upper endoscopy completed without suspicious mass/lesion. Referred to Dr. Suszanne Conners - noted anterior vocal fold soft tissue mass. On 03-23-20 Teoh performed biopsy using laryngoscopy with excision. After consult with Dr. Basilio Cairo pt  agreed to 20 fx rad to glottis. Began 04-26-20 and will end 05-25-20.      Prior Functional Status   Cognitive/Linguistic Baseline Within functional limits      Cognition   Overall Cognitive Status Within Functional Limits for tasks assessed      Auditory Comprehension   Overall Auditory Comprehension Appears within functional limits for tasks assessed      Oral Motor/Sensory Function   Overall Oral Motor/Sensory Function Appears within functional limits for tasks assessed      Motor Speech   Overall Motor Speech Appears within functional limits for tasks assessed          Pt currently tolerates mostly liquids. With water today, pt was without any overt oral difficulty, or s/sx aspiration. Thyroid elevation appeared WNL, and swallows appeared timely with water. Pt's swallow deemed WFL/WNL at this time.   Because data states the risk for dysphagia during and after radiation treatment is high due to undergoing radiation tx, SLP taught pt about the possibility of reduced/limited ability for PO intake during rad tx. SLP encouraged pt to continue swallowing POs as far into rad tx as possible. SLP talked specifically about inquiring to MD about alternative pain med to the prescription he obtained which  pt states will give him side effects that he cannot have during the day.   SLP educated pt re: changes to swallowing musculature after rad tx, and why adherence to dysphagia HEP provided today and PO consumption was necessary to inhibit muscle fibrosis following rad tx. Pt demonstrated understanding of these things to SLP.    SLP then developed a HEP for pt and pt was instructed how to perform exercises involving lingual, vocal, and pharyngeal strengthening. SLP performed each exercise and pt return demonstrated each exercise. SLP ensured pt performance was correct prior to moving on to next exercise. Pt was instructed to complete this program 2 times a day, until 6 months after his last rad tx,  then x2 a week after that.                  SLP Education - 05/13/20 1445    Education Details HEP procedure, rationale for HEP/late effects of radiation to head and neck on swallowing    Person(s) Educated Patient    Methods Explanation;Demonstration;Verbal cues;Handout    Comprehension Verbalized understanding;Returned demonstration;Verbal cues required;Need further instruction   pt states he likely won't need further instruction "These are really pretty basic" he stated               Plan - 05/13/20 1448    Clinical Impression Statement Pt presents today with functional swallowing of liquids - pt did not demonstrate interest in swallowing solids but does to complain of any overt s/sx of aspiration PNA. Mr. Colglazier stated he was unsure why he might have to return for follow up ST after SLP suggested a follow up visit in 4 weeks. Pt initially reluctantly agreed to return but then decided he will contact SLP if he has questions or difficulties in the future. SLP indicated where contact information was on the HEP.    Speech Therapy Frequency One time visit    [place] provided today    Consulted and Agree with Plan of Care Patient           Patient will benefit from skilled therapeutic intervention in order to improve the following deficits and impairments:   Dysphagia, unspecified type    Problem List Patient Active Problem List   Diagnosis Date Noted  . Malignant neoplasm of glottis ([place]) 04/07/2020    [facility] ,[place], [place]  05/13/2020, 2:51 PM  [place] [address] [place], [place], [address] Phone: [phone]   Fax:  [phone]  Name: ZALAN HENNINGSEN MRN: [id number] Date of Birth: 1946/03/12

## 2020-05-14 ENCOUNTER — Other Ambulatory Visit: Payer: Self-pay

## 2020-05-14 ENCOUNTER — Ambulatory Visit
Admission: RE | Admit: 2020-05-14 | Discharge: 2020-05-14 | Disposition: A | Discharge status: Home / Self Care | Payer: Medicare Other | Source: Ambulatory Visit | Attending: Radiation Oncology | Admitting: Radiation Oncology

## 2020-05-14 DIAGNOSIS — C320 Malignant neoplasm of glottis: Secondary | ICD-10-CM | POA: Diagnosis not present

## 2020-05-14 DIAGNOSIS — Z87891 Personal history of nicotine dependence: Secondary | ICD-10-CM | POA: Diagnosis not present

## 2020-05-17 ENCOUNTER — Encounter: Payer: Self-pay | Admitting: Radiation Oncology

## 2020-05-17 ENCOUNTER — Ambulatory Visit
Admission: RE | Admit: 2020-05-17 | Discharge: 2020-05-17 | Disposition: A | Discharge status: Home / Self Care | Payer: Medicare Other | Source: Ambulatory Visit | Attending: Radiation Oncology | Admitting: Radiation Oncology

## 2020-05-17 ENCOUNTER — Other Ambulatory Visit: Payer: Self-pay

## 2020-05-17 ENCOUNTER — Telehealth: Payer: Self-pay | Admitting: Radiation Oncology

## 2020-05-17 DIAGNOSIS — C320 Malignant neoplasm of glottis: Secondary | ICD-10-CM | POA: Diagnosis not present

## 2020-05-17 DIAGNOSIS — Z87891 Personal history of nicotine dependence: Secondary | ICD-10-CM | POA: Diagnosis not present

## 2020-05-17 LAB — BASIC METABOLIC PANEL - CANCER CENTER ONLY
Anion gap: 9 (ref 5–15)
BUN: 24 mg/dL — ABNORMAL HIGH (ref 8–23)
CO2: 28 mmol/L (ref 22–32)
Calcium: 9.6 mg/dL (ref 8.9–10.3)
Chloride: 102 mmol/L (ref 98–111)
Creatinine: 1.03 mg/dL (ref 0.61–1.24)
GFR, Estimated: >60 mL/min (ref >60)
Glucose, Bld: 107 mg/dL — ABNORMAL HIGH (ref 70–99)
Potassium: 4.1 mmol/L (ref 3.5–5.1)
Sodium: 139 mmol/L (ref 135–145)

## 2020-05-17 NOTE — Telephone Encounter (Signed)
Scheduled appt per 1/10 sch msg - pt is aware of appts.

## 2020-05-17 NOTE — Progress Notes (Signed)
I spoke with Jesse Sharp today. I gave him insurance info that he had requested. I explained the copay/co-insurance information for radiation therapy and gave him my direct number if he has anymore questions.

## 2020-05-18 ENCOUNTER — Other Ambulatory Visit: Payer: Self-pay

## 2020-05-18 ENCOUNTER — Inpatient Hospital Stay: Payer: Medicare Other | Attending: Radiation Oncology

## 2020-05-18 ENCOUNTER — Ambulatory Visit
Admission: RE | Admit: 2020-05-18 | Discharge: 2020-05-18 | Disposition: A | Discharge status: Home / Self Care | Payer: Medicare Other | Source: Ambulatory Visit | Attending: Radiation Oncology | Admitting: Radiation Oncology

## 2020-05-18 ENCOUNTER — Other Ambulatory Visit: Payer: Self-pay | Admitting: Radiation Oncology

## 2020-05-18 VITALS — BP 123/78 | HR 85 | Temp 97.8°F | Resp 17 | Ht 76.0 in | Wt 236.0 lb

## 2020-05-18 DIAGNOSIS — C320 Malignant neoplasm of glottis: Secondary | ICD-10-CM | POA: Diagnosis not present

## 2020-05-18 DIAGNOSIS — R1310 Dysphagia, unspecified: Secondary | ICD-10-CM | POA: Diagnosis not present

## 2020-05-18 DIAGNOSIS — Z87891 Personal history of nicotine dependence: Secondary | ICD-10-CM | POA: Diagnosis not present

## 2020-05-18 MED ORDER — SODIUM CHLORIDE 0.9 % IV SOLN
Freq: Once | INTRAVENOUS | Status: AC
  Filled 2020-05-18: qty 250

## 2020-05-18 MED ORDER — HYDROCODONE-ACETAMINOPHEN 7.5-325 MG/15ML PO SOLN: 10.0000 mL | ORAL | 0 refills | Status: DC | PRN

## 2020-05-18 NOTE — Patient Instructions (Signed)
Rehydration, Adult Rehydration is the replacement of body fluids, salts, and minerals (electrolytes) that are lost during dehydration. Dehydration is when there is not enough water or other fluids in the body. This happens when you lose more fluids than you take in. Common causes of dehydration include:  Not drinking enough fluids. This can occur when you are ill or doing activities that require a lot of energy, especially in hot weather.  Conditions that cause loss of water or other fluids, such as diarrhea, vomiting, sweating, or urinating a lot.  Other illnesses, such as fever or infection.  Certain medicines, such as those that remove excess fluid from the body (diuretics). Symptoms of mild or moderate dehydration may include thirst, dry lips and mouth, and dizziness. Symptoms of severe dehydration may include increased heart rate, confusion, fainting, and not urinating. For severe dehydration, you may need to get fluids through an IV at the hospital. For mild or moderate dehydration, you can usually rehydrate at home by drinking certain fluids as told by your health care provider. What are the risks? Generally, rehydration is safe. However, taking in too much fluid (overhydration) can be a problem. This is rare. Overhydration can cause an electrolyte imbalance, kidney failure, or a decrease in salt (sodium) levels in the body. Supplies needed You will need an oral rehydration solution (ORS) if your health care provider tells you to use one. This is a drink to treat dehydration. It can be found in pharmacies and retail stores. How to rehydrate Fluids Follow instructions from your health care provider for rehydration. The kind of fluid and the amount you should drink depend on your condition. In general, you should choose drinks that you prefer.  If told by your health care provider, drink an ORS. ? Make an ORS by following instructions on the package. ? Start by drinking small amounts,  about  cup (120 mL) every 5-10 minutes. ? Slowly increase how much you drink until you have taken the amount recommended by your health care provider.  Drink enough clear fluids to keep your urine pale yellow. If you were told to drink an ORS, finish it first, then start slowly drinking other clear fluids. Drink fluids such as: ? Water. This includes sparkling water and flavored water. Drinking only water can lead to having too little sodium in your body (hyponatremia). Follow the advice of your health care provider. ? Water from ice chips you suck on. ? Fruit juice with water you add to it (diluted). ? Sports drinks. ? Hot or cold herbal teas. ? Broth-based soups. ? Milk or milk products. Food Follow instructions from your health care provider about what to eat while you rehydrate. Your health care provider may recommend that you slowly begin eating regular foods in small amounts.  Eat foods that contain a healthy balance of electrolytes, such as bananas, oranges, potatoes, tomatoes, and spinach.  Avoid foods that are greasy or contain a lot of sugar. In some cases, you may get nutrition through a feeding tube that is passed through your nose and into your stomach (nasogastric tube, or NG tube). This may be done if you have uncontrolled vomiting or diarrhea.   Beverages to avoid Certain beverages may make dehydration worse. While you rehydrate, avoid drinking alcohol.   How to tell if you are recovering from dehydration You may be recovering from dehydration if:  You are urinating more often than before you started rehydrating.  Your urine is pale yellow.  Your energy level   improves.  You vomit less frequently.  You have diarrhea less frequently.  Your appetite improves or returns to normal.  You feel less dizzy or less light-headed.  Your skin tone and color start to look more normal. Follow these instructions at home:  Take over-the-counter and prescription medicines only  as told by your health care provider.  Do not take sodium tablets. Doing this can lead to having too much sodium in your body (hypernatremia). Contact a health care provider if:  You continue to have symptoms of mild or moderate dehydration, such as: ? Thirst. ? Dry lips. ? Slightly dry mouth. ? Dizziness. ? Dark urine or less urine than normal. ? Muscle cramps.  You continue to vomit or have diarrhea. Get help right away if you:  Have symptoms of dehydration that get worse.  Have a fever.  Have a severe headache.  Have been vomiting and the following happens: ? Your vomiting gets worse or does not go away. ? Your vomit includes blood or green matter (bile). ? You cannot eat or drink without vomiting.  Have problems with urination or bowel movements, such as: ? Diarrhea that gets worse or does not go away. ? Blood in your stool (feces). This may cause stool to look black and tarry. ? Not urinating, or urinating only a small amount of very dark urine, within 6-8 hours.  Have trouble breathing.  Have symptoms that get worse with treatment. These symptoms may represent a serious problem that is an emergency. Do not wait to see if the symptoms will go away. Get medical help right away. Call your local emergency services (911 in the [place] Do not drive yourself to the hospital. Summary  Rehydration is the replacement of body fluids and minerals (electrolytes) that are lost during dehydration.  Follow instructions from your health care provider for rehydration. The kind of fluid and amount you should drink depend on your condition.  Slowly increase how much you drink until you have taken the amount recommended by your health care provider.  Contact your health care provider if you continue to show signs of mild or moderate dehydration. This information is not intended to replace advice given to you by your health care provider. Make sure you discuss any questions you have with  your health care provider. Document Revised: 06/25/2019 Document Reviewed: 05/05/2019 Elsevier Patient Education  2021 Elsevier Inc.  

## 2020-05-19 ENCOUNTER — Inpatient Hospital Stay: Payer: Medicare Other

## 2020-05-19 ENCOUNTER — Other Ambulatory Visit: Payer: Self-pay

## 2020-05-19 ENCOUNTER — Ambulatory Visit
Admission: RE | Admit: 2020-05-19 | Discharge: 2020-05-19 | Disposition: A | Discharge status: Home / Self Care | Payer: Medicare Other | Source: Ambulatory Visit | Attending: Radiation Oncology | Admitting: Radiation Oncology

## 2020-05-19 DIAGNOSIS — C320 Malignant neoplasm of glottis: Secondary | ICD-10-CM | POA: Diagnosis not present

## 2020-05-19 DIAGNOSIS — Z87891 Personal history of nicotine dependence: Secondary | ICD-10-CM | POA: Diagnosis not present

## 2020-05-19 NOTE — Progress Notes (Signed)
Nutrition  Patient was a no show for nutrition appointment today.  Will send message to scheduling to offer another appointment.  Will send message to provider and nurse navigator as well.  Mylan Schwarz B. Zenia Resides, [place], [place] Registered Dietitian [phone] (mobile)

## 2020-05-20 ENCOUNTER — Ambulatory Visit
Admission: RE | Admit: 2020-05-20 | Discharge: 2020-05-20 | Disposition: A | Discharge status: Home / Self Care | Payer: Medicare Other | Source: Ambulatory Visit | Attending: Radiation Oncology | Admitting: Radiation Oncology

## 2020-05-20 DIAGNOSIS — Z87891 Personal history of nicotine dependence: Secondary | ICD-10-CM | POA: Diagnosis not present

## 2020-05-20 DIAGNOSIS — C320 Malignant neoplasm of glottis: Secondary | ICD-10-CM | POA: Diagnosis not present

## 2020-05-21 ENCOUNTER — Ambulatory Visit
Admission: RE | Admit: 2020-05-21 | Discharge: 2020-05-21 | Disposition: A | Discharge status: Home / Self Care | Payer: Medicare Other | Source: Ambulatory Visit | Attending: Radiation Oncology | Admitting: Radiation Oncology

## 2020-05-21 ENCOUNTER — Other Ambulatory Visit: Payer: Self-pay

## 2020-05-21 ENCOUNTER — Inpatient Hospital Stay: Payer: Medicare Other

## 2020-05-21 VITALS — BP 113/68 | HR 76 | Temp 98.4°F | Resp 17 | Wt 243.5 lb

## 2020-05-21 DIAGNOSIS — C320 Malignant neoplasm of glottis: Secondary | ICD-10-CM | POA: Diagnosis not present

## 2020-05-21 DIAGNOSIS — R1310 Dysphagia, unspecified: Secondary | ICD-10-CM | POA: Diagnosis not present

## 2020-05-21 DIAGNOSIS — Z87891 Personal history of nicotine dependence: Secondary | ICD-10-CM | POA: Diagnosis not present

## 2020-05-21 MED ORDER — SODIUM CHLORIDE 0.9 % IV SOLN
Freq: Once | INTRAVENOUS | Status: AC
  Filled 2020-05-21: qty 250

## 2020-05-21 NOTE — Patient Instructions (Signed)
Rehydration, Adult Rehydration is the replacement of body fluids, salts, and minerals (electrolytes) that are lost during dehydration. Dehydration is when there is not enough water or other fluids in the body. This happens when you lose more fluids than you take in. Common causes of dehydration include:  Not drinking enough fluids. This can occur when you are ill or doing activities that require a lot of energy, especially in hot weather.  Conditions that cause loss of water or other fluids, such as diarrhea, vomiting, sweating, or urinating a lot.  Other illnesses, such as fever or infection.  Certain medicines, such as those that remove excess fluid from the body (diuretics). Symptoms of mild or moderate dehydration may include thirst, dry lips and mouth, and dizziness. Symptoms of severe dehydration may include increased heart rate, confusion, fainting, and not urinating. For severe dehydration, you may need to get fluids through an IV at the hospital. For mild or moderate dehydration, you can usually rehydrate at home by drinking certain fluids as told by your health care provider. What are the risks? Generally, rehydration is safe. However, taking in too much fluid (overhydration) can be a problem. This is rare. Overhydration can cause an electrolyte imbalance, kidney failure, or a decrease in salt (sodium) levels in the body. Supplies needed You will need an oral rehydration solution (ORS) if your health care provider tells you to use one. This is a drink to treat dehydration. It can be found in pharmacies and retail stores. How to rehydrate Fluids Follow instructions from your health care provider for rehydration. The kind of fluid and the amount you should drink depend on your condition. In general, you should choose drinks that you prefer.  If told by your health care provider, drink an ORS. ? Make an ORS by following instructions on the package. ? Start by drinking small amounts,  about  cup (120 mL) every 5-10 minutes. ? Slowly increase how much you drink until you have taken the amount recommended by your health care provider.  Drink enough clear fluids to keep your urine pale yellow. If you were told to drink an ORS, finish it first, then start slowly drinking other clear fluids. Drink fluids such as: ? Water. This includes sparkling water and flavored water. Drinking only water can lead to having too little sodium in your body (hyponatremia). Follow the advice of your health care provider. ? Water from ice chips you suck on. ? Fruit juice with water you add to it (diluted). ? Sports drinks. ? Hot or cold herbal teas. ? Broth-based soups. ? Milk or milk products. Food Follow instructions from your health care provider about what to eat while you rehydrate. Your health care provider may recommend that you slowly begin eating regular foods in small amounts.  Eat foods that contain a healthy balance of electrolytes, such as bananas, oranges, potatoes, tomatoes, and spinach.  Avoid foods that are greasy or contain a lot of sugar. In some cases, you may get nutrition through a feeding tube that is passed through your nose and into your stomach (nasogastric tube, or NG tube). This may be done if you have uncontrolled vomiting or diarrhea.   Beverages to avoid Certain beverages may make dehydration worse. While you rehydrate, avoid drinking alcohol.   How to tell if you are recovering from dehydration You may be recovering from dehydration if:  You are urinating more often than before you started rehydrating.  Your urine is pale yellow.  Your energy level   improves.  You vomit less frequently.  You have diarrhea less frequently.  Your appetite improves or returns to normal.  You feel less dizzy or less light-headed.  Your skin tone and color start to look more normal. Follow these instructions at home:  Take over-the-counter and prescription medicines only  as told by your health care provider.  Do not take sodium tablets. Doing this can lead to having too much sodium in your body (hypernatremia). Contact a health care provider if:  You continue to have symptoms of mild or moderate dehydration, such as: ? Thirst. ? Dry lips. ? Slightly dry mouth. ? Dizziness. ? Dark urine or less urine than normal. ? Muscle cramps.  You continue to vomit or have diarrhea. Get help right away if you:  Have symptoms of dehydration that get worse.  Have a fever.  Have a severe headache.  Have been vomiting and the following happens: ? Your vomiting gets worse or does not go away. ? Your vomit includes blood or green matter (bile). ? You cannot eat or drink without vomiting.  Have problems with urination or bowel movements, such as: ? Diarrhea that gets worse or does not go away. ? Blood in your stool (feces). This may cause stool to look black and tarry. ? Not urinating, or urinating only a small amount of very dark urine, within 6-8 hours.  Have trouble breathing.  Have symptoms that get worse with treatment. These symptoms may represent a serious problem that is an emergency. Do not wait to see if the symptoms will go away. Get medical help right away. Call your local emergency services (911 in the [place] Do not drive yourself to the hospital. Summary  Rehydration is the replacement of body fluids and minerals (electrolytes) that are lost during dehydration.  Follow instructions from your health care provider for rehydration. The kind of fluid and amount you should drink depend on your condition.  Slowly increase how much you drink until you have taken the amount recommended by your health care provider.  Contact your health care provider if you continue to show signs of mild or moderate dehydration. This information is not intended to replace advice given to you by your health care provider. Make sure you discuss any questions you have with  your health care provider. Document Revised: 06/25/2019 Document Reviewed: 05/05/2019 Elsevier Patient Education  2021 Elsevier Inc.  

## 2020-05-24 ENCOUNTER — Other Ambulatory Visit: Payer: Self-pay

## 2020-05-24 ENCOUNTER — Ambulatory Visit
Admission: RE | Admit: 2020-05-24 | Discharge: 2020-05-24 | Disposition: A | Discharge status: Home / Self Care | Payer: Medicare Other | Source: Ambulatory Visit | Attending: Radiation Oncology | Admitting: Radiation Oncology

## 2020-05-24 DIAGNOSIS — C320 Malignant neoplasm of glottis: Secondary | ICD-10-CM | POA: Diagnosis not present

## 2020-05-24 DIAGNOSIS — Z87891 Personal history of nicotine dependence: Secondary | ICD-10-CM | POA: Diagnosis not present

## 2020-05-25 ENCOUNTER — Ambulatory Visit
Admission: RE | Admit: 2020-05-25 | Discharge: 2020-05-25 | Disposition: A | Discharge status: Home / Self Care | Payer: Medicare Other | Source: Ambulatory Visit | Attending: Radiation Oncology | Admitting: Radiation Oncology

## 2020-05-25 ENCOUNTER — Inpatient Hospital Stay: Payer: Medicare Other

## 2020-05-25 ENCOUNTER — Other Ambulatory Visit: Payer: Self-pay

## 2020-05-25 ENCOUNTER — Other Ambulatory Visit: Payer: Self-pay | Admitting: Radiation Oncology

## 2020-05-25 VITALS — BP 141/74 | HR 70 | Temp 98.0°F | Resp 18 | Ht 76.0 in | Wt 242.1 lb

## 2020-05-25 DIAGNOSIS — C320 Malignant neoplasm of glottis: Secondary | ICD-10-CM

## 2020-05-25 DIAGNOSIS — R1310 Dysphagia, unspecified: Secondary | ICD-10-CM | POA: Diagnosis not present

## 2020-05-25 DIAGNOSIS — Z87891 Personal history of nicotine dependence: Secondary | ICD-10-CM | POA: Diagnosis not present

## 2020-05-25 MED ORDER — HYDROCODONE-ACETAMINOPHEN 7.5-325 MG/15ML PO SOLN: 10.0000 mL | ORAL | 0 refills | Status: DC | PRN

## 2020-05-25 MED ORDER — SODIUM CHLORIDE 0.9 % IV SOLN
Freq: Once | INTRAVENOUS | Status: AC
  Filled 2020-05-25: qty 250

## 2020-05-25 NOTE — Patient Instructions (Signed)
Rehydration, Adult Rehydration is the replacement of body fluids, salts, and minerals (electrolytes) that are lost during dehydration. Dehydration is when there is not enough water or other fluids in the body. This happens when you lose more fluids than you take in. Common causes of dehydration include:  Not drinking enough fluids. This can occur when you are ill or doing activities that require a lot of energy, especially in hot weather.  Conditions that cause loss of water or other fluids, such as diarrhea, vomiting, sweating, or urinating a lot.  Other illnesses, such as fever or infection.  Certain medicines, such as those that remove excess fluid from the body (diuretics). Symptoms of mild or moderate dehydration may include thirst, dry lips and mouth, and dizziness. Symptoms of severe dehydration may include increased heart rate, confusion, fainting, and not urinating. For severe dehydration, you may need to get fluids through an IV at the hospital. For mild or moderate dehydration, you can usually rehydrate at home by drinking certain fluids as told by your health care provider. What are the risks? Generally, rehydration is safe. However, taking in too much fluid (overhydration) can be a problem. This is rare. Overhydration can cause an electrolyte imbalance, kidney failure, or a decrease in salt (sodium) levels in the body. Supplies needed You will need an oral rehydration solution (ORS) if your health care provider tells you to use one. This is a drink to treat dehydration. It can be found in pharmacies and retail stores. How to rehydrate Fluids Follow instructions from your health care provider for rehydration. The kind of fluid and the amount you should drink depend on your condition. In general, you should choose drinks that you prefer.  If told by your health care provider, drink an ORS. ? Make an ORS by following instructions on the package. ? Start by drinking small amounts,  about  cup (120 mL) every 5-10 minutes. ? Slowly increase how much you drink until you have taken the amount recommended by your health care provider.  Drink enough clear fluids to keep your urine pale yellow. If you were told to drink an ORS, finish it first, then start slowly drinking other clear fluids. Drink fluids such as: ? Water. This includes sparkling water and flavored water. Drinking only water can lead to having too little sodium in your body (hyponatremia). Follow the advice of your health care provider. ? Water from ice chips you suck on. ? Fruit juice with water you add to it (diluted). ? Sports drinks. ? Hot or cold herbal teas. ? Broth-based soups. ? Milk or milk products. Food Follow instructions from your health care provider about what to eat while you rehydrate. Your health care provider may recommend that you slowly begin eating regular foods in small amounts.  Eat foods that contain a healthy balance of electrolytes, such as bananas, oranges, potatoes, tomatoes, and spinach.  Avoid foods that are greasy or contain a lot of sugar. In some cases, you may get nutrition through a feeding tube that is passed through your nose and into your stomach (nasogastric tube, or NG tube). This may be done if you have uncontrolled vomiting or diarrhea.   Beverages to avoid Certain beverages may make dehydration worse. While you rehydrate, avoid drinking alcohol.   How to tell if you are recovering from dehydration You may be recovering from dehydration if:  You are urinating more often than before you started rehydrating.  Your urine is pale yellow.  Your energy level   improves.  You vomit less frequently.  You have diarrhea less frequently.  Your appetite improves or returns to normal.  You feel less dizzy or less light-headed.  Your skin tone and color start to look more normal. Follow these instructions at home:  Take over-the-counter and prescription medicines only  as told by your health care provider.  Do not take sodium tablets. Doing this can lead to having too much sodium in your body (hypernatremia). Contact a health care provider if:  You continue to have symptoms of mild or moderate dehydration, such as: ? Thirst. ? Dry lips. ? Slightly dry mouth. ? Dizziness. ? Dark urine or less urine than normal. ? Muscle cramps.  You continue to vomit or have diarrhea. Get help right away if you:  Have symptoms of dehydration that get worse.  Have a fever.  Have a severe headache.  Have been vomiting and the following happens: ? Your vomiting gets worse or does not go away. ? Your vomit includes blood or green matter (bile). ? You cannot eat or drink without vomiting.  Have problems with urination or bowel movements, such as: ? Diarrhea that gets worse or does not go away. ? Blood in your stool (feces). This may cause stool to look black and tarry. ? Not urinating, or urinating only a small amount of very dark urine, within 6-8 hours.  Have trouble breathing.  Have symptoms that get worse with treatment. These symptoms may represent a serious problem that is an emergency. Do not wait to see if the symptoms will go away. Get medical help right away. Call your local emergency services (911 in the [place] Do not drive yourself to the hospital. Summary  Rehydration is the replacement of body fluids and minerals (electrolytes) that are lost during dehydration.  Follow instructions from your health care provider for rehydration. The kind of fluid and amount you should drink depend on your condition.  Slowly increase how much you drink until you have taken the amount recommended by your health care provider.  Contact your health care provider if you continue to show signs of mild or moderate dehydration. This information is not intended to replace advice given to you by your health care provider. Make sure you discuss any questions you have with  your health care provider. Document Revised: 06/25/2019 Document Reviewed: 05/05/2019 Elsevier Patient Education  2021 Elsevier Inc.  

## 2020-05-26 DIAGNOSIS — M5116 Intervertebral disc disorders with radiculopathy, lumbar region: Secondary | ICD-10-CM | POA: Diagnosis not present

## 2020-05-26 DIAGNOSIS — M9903 Segmental and somatic dysfunction of lumbar region: Secondary | ICD-10-CM | POA: Diagnosis not present

## 2020-05-28 ENCOUNTER — Other Ambulatory Visit: Payer: Self-pay

## 2020-05-28 ENCOUNTER — Inpatient Hospital Stay: Payer: Medicare Other

## 2020-05-28 VITALS — BP 135/74 | HR 80 | Temp 98.1°F | Resp 18

## 2020-05-28 DIAGNOSIS — R1310 Dysphagia, unspecified: Secondary | ICD-10-CM | POA: Diagnosis not present

## 2020-05-28 DIAGNOSIS — C320 Malignant neoplasm of glottis: Secondary | ICD-10-CM | POA: Diagnosis not present

## 2020-05-28 MED ORDER — SODIUM CHLORIDE 0.9 % IV SOLN
Freq: Once | INTRAVENOUS | Status: AC
  Filled 2020-05-28: qty 250

## 2020-05-28 NOTE — Patient Instructions (Signed)
Rehydration, Adult Rehydration is the replacement of body fluids, salts, and minerals (electrolytes) that are lost during dehydration. Dehydration is when there is not enough water or other fluids in the body. This happens when you lose more fluids than you take in. Common causes of dehydration include:  Not drinking enough fluids. This can occur when you are ill or doing activities that require a lot of energy, especially in hot weather.  Conditions that cause loss of water or other fluids, such as diarrhea, vomiting, sweating, or urinating a lot.  Other illnesses, such as fever or infection.  Certain medicines, such as those that remove excess fluid from the body (diuretics). Symptoms of mild or moderate dehydration may include thirst, dry lips and mouth, and dizziness. Symptoms of severe dehydration may include increased heart rate, confusion, fainting, and not urinating. For severe dehydration, you may need to get fluids through an IV at the hospital. For mild or moderate dehydration, you can usually rehydrate at home by drinking certain fluids as told by your health care provider. What are the risks? Generally, rehydration is safe. However, taking in too much fluid (overhydration) can be a problem. This is rare. Overhydration can cause an electrolyte imbalance, kidney failure, or a decrease in salt (sodium) levels in the body. Supplies needed You will need an oral rehydration solution (ORS) if your health care provider tells you to use one. This is a drink to treat dehydration. It can be found in pharmacies and retail stores. How to rehydrate Fluids Follow instructions from your health care provider for rehydration. The kind of fluid and the amount you should drink depend on your condition. In general, you should choose drinks that you prefer.  If told by your health care provider, drink an ORS. ? Make an ORS by following instructions on the package. ? Start by drinking small amounts,  about  cup (120 mL) every 5-10 minutes. ? Slowly increase how much you drink until you have taken the amount recommended by your health care provider.  Drink enough clear fluids to keep your urine pale yellow. If you were told to drink an ORS, finish it first, then start slowly drinking other clear fluids. Drink fluids such as: ? Water. This includes sparkling water and flavored water. Drinking only water can lead to having too little sodium in your body (hyponatremia). Follow the advice of your health care provider. ? Water from ice chips you suck on. ? Fruit juice with water you add to it (diluted). ? Sports drinks. ? Hot or cold herbal teas. ? Broth-based soups. ? Milk or milk products. Food Follow instructions from your health care provider about what to eat while you rehydrate. Your health care provider may recommend that you slowly begin eating regular foods in small amounts.  Eat foods that contain a healthy balance of electrolytes, such as bananas, oranges, potatoes, tomatoes, and spinach.  Avoid foods that are greasy or contain a lot of sugar. In some cases, you may get nutrition through a feeding tube that is passed through your nose and into your stomach (nasogastric tube, or NG tube). This may be done if you have uncontrolled vomiting or diarrhea.   Beverages to avoid Certain beverages may make dehydration worse. While you rehydrate, avoid drinking alcohol.   How to tell if you are recovering from dehydration You may be recovering from dehydration if:  You are urinating more often than before you started rehydrating.  Your urine is pale yellow.  Your energy level   improves.  You vomit less frequently.  You have diarrhea less frequently.  Your appetite improves or returns to normal.  You feel less dizzy or less light-headed.  Your skin tone and color start to look more normal. Follow these instructions at home:  Take over-the-counter and prescription medicines only  as told by your health care provider.  Do not take sodium tablets. Doing this can lead to having too much sodium in your body (hypernatremia). Contact a health care provider if:  You continue to have symptoms of mild or moderate dehydration, such as: ? Thirst. ? Dry lips. ? Slightly dry mouth. ? Dizziness. ? Dark urine or less urine than normal. ? Muscle cramps.  You continue to vomit or have diarrhea. Get help right away if you:  Have symptoms of dehydration that get worse.  Have a fever.  Have a severe headache.  Have been vomiting and the following happens: ? Your vomiting gets worse or does not go away. ? Your vomit includes blood or green matter (bile). ? You cannot eat or drink without vomiting.  Have problems with urination or bowel movements, such as: ? Diarrhea that gets worse or does not go away. ? Blood in your stool (feces). This may cause stool to look black and tarry. ? Not urinating, or urinating only a small amount of very dark urine, within 6-8 hours.  Have trouble breathing.  Have symptoms that get worse with treatment. These symptoms may represent a serious problem that is an emergency. Do not wait to see if the symptoms will go away. Get medical help right away. Call your local emergency services (911 in the [place] Do not drive yourself to the hospital. Summary  Rehydration is the replacement of body fluids and minerals (electrolytes) that are lost during dehydration.  Follow instructions from your health care provider for rehydration. The kind of fluid and amount you should drink depend on your condition.  Slowly increase how much you drink until you have taken the amount recommended by your health care provider.  Contact your health care provider if you continue to show signs of mild or moderate dehydration. This information is not intended to replace advice given to you by your health care provider. Make sure you discuss any questions you have with  your health care provider. Document Revised: 06/25/2019 Document Reviewed: 05/05/2019 Elsevier Patient Education  2021 Elsevier Inc.  

## 2020-05-28 NOTE — Progress Notes (Signed)
Oncology Nurse Navigator Documentation ° °I met with Jesse Sharp briefly while he was receiving IVF in Medical Oncology. He completed radiation on 1/18 and is receiving supplemental IVF. He is feeling well and denies concerns at this time. He knows to call me if he has any questions or needs.  ° °  RN, BSN, OCN °Head & Neck Oncology Nurse Navigator °[facility] at [facility] °Phone # [phone]  °Fax # [phone] °

## 2020-06-01 ENCOUNTER — Inpatient Hospital Stay: Payer: Medicare Other

## 2020-06-01 ENCOUNTER — Other Ambulatory Visit: Payer: Self-pay

## 2020-06-01 VITALS — BP 150/77 | HR 74 | Temp 97.6°F | Resp 18

## 2020-06-01 DIAGNOSIS — C320 Malignant neoplasm of glottis: Secondary | ICD-10-CM | POA: Diagnosis not present

## 2020-06-01 DIAGNOSIS — R1310 Dysphagia, unspecified: Secondary | ICD-10-CM | POA: Diagnosis not present

## 2020-06-01 MED ORDER — SODIUM CHLORIDE 0.9 % IV SOLN
Freq: Once | INTRAVENOUS | Status: AC
  Filled 2020-06-01: qty 250

## 2020-06-01 NOTE — Patient Instructions (Signed)
Rehydration, Adult Rehydration is the replacement of body fluids, salts, and minerals (electrolytes) that are lost during dehydration. Dehydration is when there is not enough water or other fluids in the body. This happens when you lose more fluids than you take in. Common causes of dehydration include:  Not drinking enough fluids. This can occur when you are ill or doing activities that require a lot of energy, especially in hot weather.  Conditions that cause loss of water or other fluids, such as diarrhea, vomiting, sweating, or urinating a lot.  Other illnesses, such as fever or infection.  Certain medicines, such as those that remove excess fluid from the body (diuretics). Symptoms of mild or moderate dehydration may include thirst, dry lips and mouth, and dizziness. Symptoms of severe dehydration may include increased heart rate, confusion, fainting, and not urinating. For severe dehydration, you may need to get fluids through an IV at the hospital. For mild or moderate dehydration, you can usually rehydrate at home by drinking certain fluids as told by your health care provider. What are the risks? Generally, rehydration is safe. However, taking in too much fluid (overhydration) can be a problem. This is rare. Overhydration can cause an electrolyte imbalance, kidney failure, or a decrease in salt (sodium) levels in the body. Supplies needed You will need an oral rehydration solution (ORS) if your health care provider tells you to use one. This is a drink to treat dehydration. It can be found in pharmacies and retail stores. How to rehydrate Fluids Follow instructions from your health care provider for rehydration. The kind of fluid and the amount you should drink depend on your condition. In general, you should choose drinks that you prefer.  If told by your health care provider, drink an ORS. ? Make an ORS by following instructions on the package. ? Start by drinking small amounts,  about  cup (120 mL) every 5-10 minutes. ? Slowly increase how much you drink until you have taken the amount recommended by your health care provider.  Drink enough clear fluids to keep your urine pale yellow. If you were told to drink an ORS, finish it first, then start slowly drinking other clear fluids. Drink fluids such as: ? Water. This includes sparkling water and flavored water. Drinking only water can lead to having too little sodium in your body (hyponatremia). Follow the advice of your health care provider. ? Water from ice chips you suck on. ? Fruit juice with water you add to it (diluted). ? Sports drinks. ? Hot or cold herbal teas. ? Broth-based soups. ? Milk or milk products. Food Follow instructions from your health care provider about what to eat while you rehydrate. Your health care provider may recommend that you slowly begin eating regular foods in small amounts.  Eat foods that contain a healthy balance of electrolytes, such as bananas, oranges, potatoes, tomatoes, and spinach.  Avoid foods that are greasy or contain a lot of sugar. In some cases, you may get nutrition through a feeding tube that is passed through your nose and into your stomach (nasogastric tube, or NG tube). This may be done if you have uncontrolled vomiting or diarrhea.   Beverages to avoid Certain beverages may make dehydration worse. While you rehydrate, avoid drinking alcohol.   How to tell if you are recovering from dehydration You may be recovering from dehydration if:  You are urinating more often than before you started rehydrating.  Your urine is pale yellow.  Your energy level   improves.  You vomit less frequently.  You have diarrhea less frequently.  Your appetite improves or returns to normal.  You feel less dizzy or less light-headed.  Your skin tone and color start to look more normal. Follow these instructions at home:  Take over-the-counter and prescription medicines only  as told by your health care provider.  Do not take sodium tablets. Doing this can lead to having too much sodium in your body (hypernatremia). Contact a health care provider if:  You continue to have symptoms of mild or moderate dehydration, such as: ? Thirst. ? Dry lips. ? Slightly dry mouth. ? Dizziness. ? Dark urine or less urine than normal. ? Muscle cramps.  You continue to vomit or have diarrhea. Get help right away if you:  Have symptoms of dehydration that get worse.  Have a fever.  Have a severe headache.  Have been vomiting and the following happens: ? Your vomiting gets worse or does not go away. ? Your vomit includes blood or green matter (bile). ? You cannot eat or drink without vomiting.  Have problems with urination or bowel movements, such as: ? Diarrhea that gets worse or does not go away. ? Blood in your stool (feces). This may cause stool to look black and tarry. ? Not urinating, or urinating only a small amount of very dark urine, within 6-8 hours.  Have trouble breathing.  Have symptoms that get worse with treatment. These symptoms may represent a serious problem that is an emergency. Do not wait to see if the symptoms will go away. Get medical help right away. Call your local emergency services (911 in the [place] Do not drive yourself to the hospital. Summary  Rehydration is the replacement of body fluids and minerals (electrolytes) that are lost during dehydration.  Follow instructions from your health care provider for rehydration. The kind of fluid and amount you should drink depend on your condition.  Slowly increase how much you drink until you have taken the amount recommended by your health care provider.  Contact your health care provider if you continue to show signs of mild or moderate dehydration. This information is not intended to replace advice given to you by your health care provider. Make sure you discuss any questions you have with  your health care provider. Document Revised: 06/25/2019 Document Reviewed: 05/05/2019 Elsevier Patient Education  2021 Elsevier Inc.  

## 2020-06-01 NOTE — Progress Notes (Signed)
Nutrition Assessment   Reason for Assessment:  Referral Dr Isidore Moos.     ASSESSMENT:  75 year old male with stage 1 glottis vocal cord cancer.  Patient received and has completed radiation on 1/18.  Met with patient during IV fluids.  Patient reports that he has no taste, painful swallowing, consuming mostly liquids.  Does not like ensure, boost shakes.  Drinking about 4 Cookout milkshakes per day (500-750 calories each).    Medications: prilosec, lidocaine   Labs: reviewed   Anthropometrics:   Height: 76 inches Weight: 242 lb on 1/18  238 lb 11/16 243 lb 12/10 BMI: 29  NUTRITION DIAGNOSIS: Inadequate oral intake related to cancer related treatment side effects as evidenced by unable to consume solid foods, no taste, painful swallowing   INTERVENTION:  Reviewed calories content in Cookout shakes. Patient not interested in recipes for shakes or other items.  Discussed pureeing foods. Has Ninja at home. Prefers to buy shakes Encouraged consuming high calories and protein for weight maintenance. Contact information provided    MONITORING, EVALUATION, GOAL: weight trends, intake   Next Visit: phone call/text per patient request ~2-3 weeks.  Jesse Sharp B. Zenia Resides, [place], [place] Registered Dietitian [phone] (mobile)

## 2020-06-04 ENCOUNTER — Other Ambulatory Visit: Payer: Self-pay

## 2020-06-04 ENCOUNTER — Inpatient Hospital Stay: Payer: Medicare Other

## 2020-06-04 VITALS — BP 122/68 | HR 75 | Resp 18

## 2020-06-04 DIAGNOSIS — C320 Malignant neoplasm of glottis: Secondary | ICD-10-CM

## 2020-06-04 DIAGNOSIS — R1310 Dysphagia, unspecified: Secondary | ICD-10-CM | POA: Diagnosis not present

## 2020-06-04 MED ORDER — SODIUM CHLORIDE 0.9 % IV SOLN
Freq: Once | INTRAVENOUS | Status: AC
  Filled 2020-06-04: qty 250

## 2020-06-04 NOTE — Progress Notes (Signed)
Per Dr. Isidore Moos: OK to run IVF as fast as patient can tolerate

## 2020-06-04 NOTE — Patient Instructions (Signed)
Rehydration, Adult Rehydration is the replacement of body fluids, salts, and minerals (electrolytes) that are lost during dehydration. Dehydration is when there is not enough water or other fluids in the body. This happens when you lose more fluids than you take in. Common causes of dehydration include:  Not drinking enough fluids. This can occur when you are ill or doing activities that require a lot of energy, especially in hot weather.  Conditions that cause loss of water or other fluids, such as diarrhea, vomiting, sweating, or urinating a lot.  Other illnesses, such as fever or infection.  Certain medicines, such as those that remove excess fluid from the body (diuretics). Symptoms of mild or moderate dehydration may include thirst, dry lips and mouth, and dizziness. Symptoms of severe dehydration may include increased heart rate, confusion, fainting, and not urinating. For severe dehydration, you may need to get fluids through an IV at the hospital. For mild or moderate dehydration, you can usually rehydrate at home by drinking certain fluids as told by your health care provider. What are the risks? Generally, rehydration is safe. However, taking in too much fluid (overhydration) can be a problem. This is rare. Overhydration can cause an electrolyte imbalance, kidney failure, or a decrease in salt (sodium) levels in the body. Supplies needed You will need an oral rehydration solution (ORS) if your health care provider tells you to use one. This is a drink to treat dehydration. It can be found in pharmacies and retail stores. How to rehydrate Fluids Follow instructions from your health care provider for rehydration. The kind of fluid and the amount you should drink depend on your condition. In general, you should choose drinks that you prefer.  If told by your health care provider, drink an ORS. ? Make an ORS by following instructions on the package. ? Start by drinking small amounts,  about  cup (120 mL) every 5-10 minutes. ? Slowly increase how much you drink until you have taken the amount recommended by your health care provider.  Drink enough clear fluids to keep your urine pale yellow. If you were told to drink an ORS, finish it first, then start slowly drinking other clear fluids. Drink fluids such as: ? Water. This includes sparkling water and flavored water. Drinking only water can lead to having too little sodium in your body (hyponatremia). Follow the advice of your health care provider. ? Water from ice chips you suck on. ? Fruit juice with water you add to it (diluted). ? Sports drinks. ? Hot or cold herbal teas. ? Broth-based soups. ? Milk or milk products. Food Follow instructions from your health care provider about what to eat while you rehydrate. Your health care provider may recommend that you slowly begin eating regular foods in small amounts.  Eat foods that contain a healthy balance of electrolytes, such as bananas, oranges, potatoes, tomatoes, and spinach.  Avoid foods that are greasy or contain a lot of sugar. In some cases, you may get nutrition through a feeding tube that is passed through your nose and into your stomach (nasogastric tube, or NG tube). This may be done if you have uncontrolled vomiting or diarrhea.   Beverages to avoid Certain beverages may make dehydration worse. While you rehydrate, avoid drinking alcohol.   How to tell if you are recovering from dehydration You may be recovering from dehydration if:  You are urinating more often than before you started rehydrating.  Your urine is pale yellow.  Your energy level   improves.  You vomit less frequently.  You have diarrhea less frequently.  Your appetite improves or returns to normal.  You feel less dizzy or less light-headed.  Your skin tone and color start to look more normal. Follow these instructions at home:  Take over-the-counter and prescription medicines only  as told by your health care provider.  Do not take sodium tablets. Doing this can lead to having too much sodium in your body (hypernatremia). Contact a health care provider if:  You continue to have symptoms of mild or moderate dehydration, such as: ? Thirst. ? Dry lips. ? Slightly dry mouth. ? Dizziness. ? Dark urine or less urine than normal. ? Muscle cramps.  You continue to vomit or have diarrhea. Get help right away if you:  Have symptoms of dehydration that get worse.  Have a fever.  Have a severe headache.  Have been vomiting and the following happens: ? Your vomiting gets worse or does not go away. ? Your vomit includes blood or green matter (bile). ? You cannot eat or drink without vomiting.  Have problems with urination or bowel movements, such as: ? Diarrhea that gets worse or does not go away. ? Blood in your stool (feces). This may cause stool to look black and tarry. ? Not urinating, or urinating only a small amount of very dark urine, within 6-8 hours.  Have trouble breathing.  Have symptoms that get worse with treatment. These symptoms may represent a serious problem that is an emergency. Do not wait to see if the symptoms will go away. Get medical help right away. Call your local emergency services (911 in the [place] Do not drive yourself to the hospital. Summary  Rehydration is the replacement of body fluids and minerals (electrolytes) that are lost during dehydration.  Follow instructions from your health care provider for rehydration. The kind of fluid and amount you should drink depend on your condition.  Slowly increase how much you drink until you have taken the amount recommended by your health care provider.  Contact your health care provider if you continue to show signs of mild or moderate dehydration. This information is not intended to replace advice given to you by your health care provider. Make sure you discuss any questions you have with  your health care provider. Document Revised: 06/25/2019 Document Reviewed: 05/05/2019 Elsevier Patient Education  2021 Elsevier Inc.  

## 2020-06-08 ENCOUNTER — Other Ambulatory Visit: Payer: Self-pay

## 2020-06-08 ENCOUNTER — Inpatient Hospital Stay: Payer: Medicare Other | Attending: Radiation Oncology

## 2020-06-08 VITALS — BP 136/81 | HR 85 | Temp 97.7°F | Resp 18

## 2020-06-08 DIAGNOSIS — C320 Malignant neoplasm of glottis: Secondary | ICD-10-CM | POA: Insufficient documentation

## 2020-06-08 MED ORDER — SODIUM CHLORIDE 0.9 % IV SOLN
Freq: Once | INTRAVENOUS | Status: AC
  Filled 2020-06-08: qty 250

## 2020-06-08 NOTE — Patient Instructions (Signed)
Rehydration, Adult Rehydration is the replacement of body fluids, salts, and minerals (electrolytes) that are lost during dehydration. Dehydration is when there is not enough water or other fluids in the body. This happens when you lose more fluids than you take in. Common causes of dehydration include:  Not drinking enough fluids. This can occur when you are ill or doing activities that require a lot of energy, especially in hot weather.  Conditions that cause loss of water or other fluids, such as diarrhea, vomiting, sweating, or urinating a lot.  Other illnesses, such as fever or infection.  Certain medicines, such as those that remove excess fluid from the body (diuretics). Symptoms of mild or moderate dehydration may include thirst, dry lips and mouth, and dizziness. Symptoms of severe dehydration may include increased heart rate, confusion, fainting, and not urinating. For severe dehydration, you may need to get fluids through an IV at the hospital. For mild or moderate dehydration, you can usually rehydrate at home by drinking certain fluids as told by your health care provider. What are the risks? Generally, rehydration is safe. However, taking in too much fluid (overhydration) can be a problem. This is rare. Overhydration can cause an electrolyte imbalance, kidney failure, or a decrease in salt (sodium) levels in the body. Supplies needed You will need an oral rehydration solution (ORS) if your health care provider tells you to use one. This is a drink to treat dehydration. It can be found in pharmacies and retail stores. How to rehydrate Fluids Follow instructions from your health care provider for rehydration. The kind of fluid and the amount you should drink depend on your condition. In general, you should choose drinks that you prefer.  If told by your health care provider, drink an ORS. ? Make an ORS by following instructions on the package. ? Start by drinking small amounts,  about  cup (120 mL) every 5-10 minutes. ? Slowly increase how much you drink until you have taken the amount recommended by your health care provider.  Drink enough clear fluids to keep your urine pale yellow. If you were told to drink an ORS, finish it first, then start slowly drinking other clear fluids. Drink fluids such as: ? Water. This includes sparkling water and flavored water. Drinking only water can lead to having too little sodium in your body (hyponatremia). Follow the advice of your health care provider. ? Water from ice chips you suck on. ? Fruit juice with water you add to it (diluted). ? Sports drinks. ? Hot or cold herbal teas. ? Broth-based soups. ? Milk or milk products. Food Follow instructions from your health care provider about what to eat while you rehydrate. Your health care provider may recommend that you slowly begin eating regular foods in small amounts.  Eat foods that contain a healthy balance of electrolytes, such as bananas, oranges, potatoes, tomatoes, and spinach.  Avoid foods that are greasy or contain a lot of sugar. In some cases, you may get nutrition through a feeding tube that is passed through your nose and into your stomach (nasogastric tube, or NG tube). This may be done if you have uncontrolled vomiting or diarrhea.   Beverages to avoid Certain beverages may make dehydration worse. While you rehydrate, avoid drinking alcohol.   How to tell if you are recovering from dehydration You may be recovering from dehydration if:  You are urinating more often than before you started rehydrating.  Your urine is pale yellow.  Your energy level   improves.  You vomit less frequently.  You have diarrhea less frequently.  Your appetite improves or returns to normal.  You feel less dizzy or less light-headed.  Your skin tone and color start to look more normal. Follow these instructions at home:  Take over-the-counter and prescription medicines only  as told by your health care provider.  Do not take sodium tablets. Doing this can lead to having too much sodium in your body (hypernatremia). Contact a health care provider if:  You continue to have symptoms of mild or moderate dehydration, such as: ? Thirst. ? Dry lips. ? Slightly dry mouth. ? Dizziness. ? Dark urine or less urine than normal. ? Muscle cramps.  You continue to vomit or have diarrhea. Get help right away if you:  Have symptoms of dehydration that get worse.  Have a fever.  Have a severe headache.  Have been vomiting and the following happens: ? Your vomiting gets worse or does not go away. ? Your vomit includes blood or green matter (bile). ? You cannot eat or drink without vomiting.  Have problems with urination or bowel movements, such as: ? Diarrhea that gets worse or does not go away. ? Blood in your stool (feces). This may cause stool to look black and tarry. ? Not urinating, or urinating only a small amount of very dark urine, within 6-8 hours.  Have trouble breathing.  Have symptoms that get worse with treatment. These symptoms may represent a serious problem that is an emergency. Do not wait to see if the symptoms will go away. Get medical help right away. Call your local emergency services (911 in the [place] Do not drive yourself to the hospital. Summary  Rehydration is the replacement of body fluids and minerals (electrolytes) that are lost during dehydration.  Follow instructions from your health care provider for rehydration. The kind of fluid and amount you should drink depend on your condition.  Slowly increase how much you drink until you have taken the amount recommended by your health care provider.  Contact your health care provider if you continue to show signs of mild or moderate dehydration. This information is not intended to replace advice given to you by your health care provider. Make sure you discuss any questions you have with  your health care provider. Document Revised: 06/25/2019 Document Reviewed: 05/05/2019 Elsevier Patient Education  2021 Elsevier Inc.  

## 2020-06-11 ENCOUNTER — Encounter: Payer: Self-pay | Admitting: Radiation Oncology

## 2020-06-11 ENCOUNTER — Telehealth: Payer: Self-pay | Admitting: Radiation Oncology

## 2020-06-11 ENCOUNTER — Inpatient Hospital Stay: Payer: Medicare Other

## 2020-06-11 ENCOUNTER — Other Ambulatory Visit: Payer: Self-pay

## 2020-06-11 ENCOUNTER — Ambulatory Visit
Admission: RE | Admit: 2020-06-11 | Discharge: 2020-06-11 | Disposition: A | Discharge status: Home / Self Care | Payer: Medicare Other | Source: Ambulatory Visit | Attending: Radiation Oncology | Admitting: Radiation Oncology

## 2020-06-11 VITALS — BP 111/62 | HR 85 | Temp 98.2°F | Resp 18

## 2020-06-11 VITALS — BP 111/62 | HR 85 | Temp 98.2°F | Resp 18 | Wt 245.2 lb

## 2020-06-11 DIAGNOSIS — Z79899 Other long term (current) drug therapy: Secondary | ICD-10-CM | POA: Diagnosis not present

## 2020-06-11 DIAGNOSIS — C320 Malignant neoplasm of glottis: Secondary | ICD-10-CM | POA: Diagnosis not present

## 2020-06-11 DIAGNOSIS — Z923 Personal history of irradiation: Secondary | ICD-10-CM | POA: Insufficient documentation

## 2020-06-11 DIAGNOSIS — R490 Dysphonia: Secondary | ICD-10-CM | POA: Insufficient documentation

## 2020-06-11 MED ORDER — HYDROCODONE-ACETAMINOPHEN 7.5-325 MG/15ML PO SOLN: 10.0000 mL | ORAL | 0 refills | Status: DC | PRN

## 2020-06-11 MED ORDER — SODIUM CHLORIDE 0.9 % IV SOLN
Freq: Once | INTRAVENOUS | Status: AC
  Filled 2020-06-11: qty 250

## 2020-06-11 NOTE — Telephone Encounter (Signed)
Scheduled appt per 2/4 sch msg - - unable to reach pt . Left message for pt with apt date and time

## 2020-06-11 NOTE — Progress Notes (Signed)
Patient presents today for 2 week follow-up after completing radiation to his larynx on 05/25/2020  Pain issues, if any: Reports his throat is still very sore, and it's painful to swallow Using a feeding tube?: N/A Weight changes, if any:  Wt Readings from Last 3 Encounters:  06/11/20 245 lb 4 oz (111.2 kg)  05/25/20 242 lb 1.6 oz (109.8 kg)  05/21/20 243 lb 8 oz (110.5 kg)   Swallowing issues, if any: Yes--can only tolerate liquids or very soft foods (pudding, scrambled eggs, oatmeal, etc) Smoking or chewing tobacco? None Using fluoride trays daily? N/A Last ENT visit was on: Not since diagnosis Other notable issues, if any: Still dealing with vocal hoarseness and dry mouth. Denies any ear or jaw pain, or difficulty opening his mouth. Occasionally reports a sensitive gag reflex when trying to swallow. Denies any symptoms of lymphedema, and skin appears to be healing well in treatment field  Vitals:   06/11/20 1056  BP: 111/62  Pulse: 85  Resp: 18  Temp: 98.2 F (36.8 C)  SpO2: 100%

## 2020-06-11 NOTE — Progress Notes (Signed)
Radiation Oncology         [id number]) [phone] ________________________________  Name: Jesse Sharp MRN: [id number]  Date: 06/11/2020  DOB: Dec 09, 1945  Follow-Up Visit Note  CC: Audley Hose, MD  Audley Hose, MD  Diagnosis and Prior Radiotherapy:       ICD-10-CM   1. Malignant neoplasm of glottis (HCC)  C32.0 HYDROcodone-acetaminophen (HYCET) 7.5-325 mg/15 ml solution    CHIEF COMPLAINT:  Here for follow-up and surveillance of glottic cancer  Narrative:  The patient returns today for routine follow-up.   Patient presents today for 2 week follow-up after completing radiation to his larynx on 05/25/2020  Pain issues, if any: Reports his throat is still very sore, and it's painful to swallow Using a feeding tube?: N/A; he is getting IV fluids twice a week.  He swallows about 2 cups of clear fluids a day. Weight changes, if any:  Wt Readings from Last 3 Encounters:  06/11/20 245 lb 4 oz (111.2 kg)  05/25/20 242 lb 1.6 oz (109.8 kg)  05/21/20 243 lb 8 oz (110.5 kg)   Swallowing issues, if any: Yes--can only tolerate liquids or very soft foods (pudding, scrambled eggs, oatmeal, etc) Smoking or chewing tobacco? None Using fluoride trays daily? N/A Last ENT visit was on: Not since diagnosis Other notable issues, if any: Still dealing with vocal hoarseness and dry mouth. Denies any ear or jaw pain, or difficulty opening his mouth. Occasionally reports a sensitive gag reflex when trying to swallow. Denies any symptoms of lymphedema, and skin appears to be healing well in treatment field  Vitals:   06/11/20 1056  BP: 111/62  Pulse: 85  Resp: 18  Temp: 98.2 F (36.8 C)  SpO2: 100%                       ALLERGIES:  has No Known Allergies.  Meds: Current Outpatient Medications  Medication Sig Dispense Refill  . ALPRAZolam (XANAX) 0.5 MG tablet Take 0.5 mg by mouth at bedtime as needed for anxiety.    Marland Kitchen HYDROcodone-acetaminophen (HYCET) 7.5-325 mg/15 ml solution Take  10-15 mLs by mouth every 4 (four) hours as needed for moderate pain. Take with food. 473 mL 0  . lidocaine (XYLOCAINE) 2 % solution Patient: Mix 1part 2% viscous lidocaine, 1part H20. Swallow 36mL of diluted mixture, 82min before meals and at bedtime, up to QID for sore throat. 250 mL 3  . losartan (COZAAR) 25 MG tablet Take 12.5 mg by mouth daily.    Marland Kitchen omeprazole (PRILOSEC) 40 MG capsule Take 40 mg by mouth 2 (two) times daily.    . simvastatin (ZOCOR) 20 MG tablet Take 20 mg by mouth daily.    Marland Kitchen triamcinolone cream (KENALOG) 0.1 % Apply 1 application topically 2 (two) times daily. 30 g 0   No current facility-administered medications for this encounter.    Physical Findings: The patient is in no acute distress. Patient is alert and oriented. Wt Readings from Last 3 Encounters:  06/11/20 245 lb 4 oz (111.2 kg)  05/25/20 242 lb 1.6 oz (109.8 kg)  05/21/20 243 lb 8 oz (110.5 kg)    weight is 245 lb 4 oz (111.2 kg). His oral temperature is 98.2 F (36.8 C). His blood pressure is 111/62 and his pulse is 85. His respiration is 18 and oxygen saturation is 100%. .  General: Alert and oriented, in no acute distress HEENT: Head is normocephalic. Extraocular movements are intact. Oropharynx is notable for  clear mucosa Neck: Neck is notable for erythematous patch over anterior neck and radiation field.  Skin is healing well.  No peeling. Skin: Skin in treatment fields shows satisfactory healing   Psychiatric: Judgment and insight are intact. Affect is appropriate.   Lab Findings: Lab Results  Component Value Date   WBC 6.0 04/15/2020   HGB 15.6 04/15/2020   HCT 45.7 04/15/2020   MCV 88.4 04/15/2020   PLT 210 04/15/2020    Lab Results  Component Value Date   TSH 0.686 04/15/2020    Radiographic Findings: No results found.  Impression/Plan:    1) Head and Neck Cancer Status: Healing from radiation therapy.  2) Nutritional Status: Maintaining his weight.  Still requires IV fluids.   He knows that we can cancel his appointments once he is drinking 8 cups of clear fluid daily PEG tube: None  3) Risk Factors: The patient has been educated about risk factors including alcohol and tobacco abuse; they understand that avoidance of alcohol and tobacco is important to prevent recurrences as well as other cancers  4) Swallowing: Continue swallowing exercises, functional but painful.  Hydrocodone refilled today.  5) increased hoarseness: Expected side effect of radiation therapy.  He understands that this will likely improve over the next several weeks as the inflammation in his throat goes down  6) Thyroid function: Check annually Lab Results  Component Value Date   TSH 0.686 04/15/2020    7) Other: Continue applying healing lotion to skin.  We will refer him back to otolaryngology for follow-up in about 2 months.  I will see her back in 5 months.  8) He knows to call if he has any concerns or questions in the future.  On date of service, in total, I spent 20 minutes on this encounter. Patient was seen in person. _____________________________________   Eppie Gibson, MD

## 2020-06-11 NOTE — Patient Instructions (Signed)
Rehydration, Adult Rehydration is the replacement of body fluids, salts, and minerals (electrolytes) that are lost during dehydration. Dehydration is when there is not enough water or other fluids in the body. This happens when you lose more fluids than you take in. Common causes of dehydration include:  Not drinking enough fluids. This can occur when you are ill or doing activities that require a lot of energy, especially in hot weather.  Conditions that cause loss of water or other fluids, such as diarrhea, vomiting, sweating, or urinating a lot.  Other illnesses, such as fever or infection.  Certain medicines, such as those that remove excess fluid from the body (diuretics). Symptoms of mild or moderate dehydration may include thirst, dry lips and mouth, and dizziness. Symptoms of severe dehydration may include increased heart rate, confusion, fainting, and not urinating. For severe dehydration, you may need to get fluids through an IV at the hospital. For mild or moderate dehydration, you can usually rehydrate at home by drinking certain fluids as told by your health care provider. What are the risks? Generally, rehydration is safe. However, taking in too much fluid (overhydration) can be a problem. This is rare. Overhydration can cause an electrolyte imbalance, kidney failure, or a decrease in salt (sodium) levels in the body. Supplies needed You will need an oral rehydration solution (ORS) if your health care provider tells you to use one. This is a drink to treat dehydration. It can be found in pharmacies and retail stores. How to rehydrate Fluids Follow instructions from your health care provider for rehydration. The kind of fluid and the amount you should drink depend on your condition. In general, you should choose drinks that you prefer.  If told by your health care provider, drink an ORS. ? Make an ORS by following instructions on the package. ? Start by drinking small amounts,  about  cup (120 mL) every 5-10 minutes. ? Slowly increase how much you drink until you have taken the amount recommended by your health care provider.  Drink enough clear fluids to keep your urine pale yellow. If you were told to drink an ORS, finish it first, then start slowly drinking other clear fluids. Drink fluids such as: ? Water. This includes sparkling water and flavored water. Drinking only water can lead to having too little sodium in your body (hyponatremia). Follow the advice of your health care provider. ? Water from ice chips you suck on. ? Fruit juice with water you add to it (diluted). ? Sports drinks. ? Hot or cold herbal teas. ? Broth-based soups. ? Milk or milk products. Food Follow instructions from your health care provider about what to eat while you rehydrate. Your health care provider may recommend that you slowly begin eating regular foods in small amounts.  Eat foods that contain a healthy balance of electrolytes, such as bananas, oranges, potatoes, tomatoes, and spinach.  Avoid foods that are greasy or contain a lot of sugar. In some cases, you may get nutrition through a feeding tube that is passed through your nose and into your stomach (nasogastric tube, or NG tube). This may be done if you have uncontrolled vomiting or diarrhea.   Beverages to avoid Certain beverages may make dehydration worse. While you rehydrate, avoid drinking alcohol.   How to tell if you are recovering from dehydration You may be recovering from dehydration if:  You are urinating more often than before you started rehydrating.  Your urine is pale yellow.  Your energy level   improves.  You vomit less frequently.  You have diarrhea less frequently.  Your appetite improves or returns to normal.  You feel less dizzy or less light-headed.  Your skin tone and color start to look more normal. Follow these instructions at home:  Take over-the-counter and prescription medicines only  as told by your health care provider.  Do not take sodium tablets. Doing this can lead to having too much sodium in your body (hypernatremia). Contact a health care provider if:  You continue to have symptoms of mild or moderate dehydration, such as: ? Thirst. ? Dry lips. ? Slightly dry mouth. ? Dizziness. ? Dark urine or less urine than normal. ? Muscle cramps.  You continue to vomit or have diarrhea. Get help right away if you:  Have symptoms of dehydration that get worse.  Have a fever.  Have a severe headache.  Have been vomiting and the following happens: ? Your vomiting gets worse or does not go away. ? Your vomit includes blood or green matter (bile). ? You cannot eat or drink without vomiting.  Have problems with urination or bowel movements, such as: ? Diarrhea that gets worse or does not go away. ? Blood in your stool (feces). This may cause stool to look black and tarry. ? Not urinating, or urinating only a small amount of very dark urine, within 6-8 hours.  Have trouble breathing.  Have symptoms that get worse with treatment. These symptoms may represent a serious problem that is an emergency. Do not wait to see if the symptoms will go away. Get medical help right away. Call your local emergency services (911 in the [place] Do not drive yourself to the hospital. Summary  Rehydration is the replacement of body fluids and minerals (electrolytes) that are lost during dehydration.  Follow instructions from your health care provider for rehydration. The kind of fluid and amount you should drink depend on your condition.  Slowly increase how much you drink until you have taken the amount recommended by your health care provider.  Contact your health care provider if you continue to show signs of mild or moderate dehydration. This information is not intended to replace advice given to you by your health care provider. Make sure you discuss any questions you have with  your health care provider. Document Revised: 06/25/2019 Document Reviewed: 05/05/2019 Elsevier Patient Education  2021 Elsevier Inc.  

## 2020-06-15 ENCOUNTER — Inpatient Hospital Stay: Payer: Medicare Other

## 2020-06-15 ENCOUNTER — Other Ambulatory Visit: Payer: Self-pay

## 2020-06-15 VITALS — BP 122/80 | HR 69 | Temp 97.7°F | Resp 18 | Wt 236.5 lb

## 2020-06-15 DIAGNOSIS — C320 Malignant neoplasm of glottis: Secondary | ICD-10-CM

## 2020-06-15 MED ORDER — SODIUM CHLORIDE 0.9 % IV SOLN
Freq: Once | INTRAVENOUS | Status: AC
  Filled 2020-06-15: qty 250

## 2020-06-15 NOTE — Patient Instructions (Signed)
Rehydration, Adult Rehydration is the replacement of body fluids, salts, and minerals (electrolytes) that are lost during dehydration. Dehydration is when there is not enough water or other fluids in the body. This happens when you lose more fluids than you take in. Common causes of dehydration include:  Not drinking enough fluids. This can occur when you are ill or doing activities that require a lot of energy, especially in hot weather.  Conditions that cause loss of water or other fluids, such as diarrhea, vomiting, sweating, or urinating a lot.  Other illnesses, such as fever or infection.  Certain medicines, such as those that remove excess fluid from the body (diuretics). Symptoms of mild or moderate dehydration may include thirst, dry lips and mouth, and dizziness. Symptoms of severe dehydration may include increased heart rate, confusion, fainting, and not urinating. For severe dehydration, you may need to get fluids through an IV at the hospital. For mild or moderate dehydration, you can usually rehydrate at home by drinking certain fluids as told by your health care provider. What are the risks? Generally, rehydration is safe. However, taking in too much fluid (overhydration) can be a problem. This is rare. Overhydration can cause an electrolyte imbalance, kidney failure, or a decrease in salt (sodium) levels in the body. Supplies needed You will need an oral rehydration solution (ORS) if your health care provider tells you to use one. This is a drink to treat dehydration. It can be found in pharmacies and retail stores. How to rehydrate Fluids Follow instructions from your health care provider for rehydration. The kind of fluid and the amount you should drink depend on your condition. In general, you should choose drinks that you prefer.  If told by your health care provider, drink an ORS. ? Make an ORS by following instructions on the package. ? Start by drinking small amounts,  about  cup (120 mL) every 5-10 minutes. ? Slowly increase how much you drink until you have taken the amount recommended by your health care provider.  Drink enough clear fluids to keep your urine pale yellow. If you were told to drink an ORS, finish it first, then start slowly drinking other clear fluids. Drink fluids such as: ? Water. This includes sparkling water and flavored water. Drinking only water can lead to having too little sodium in your body (hyponatremia). Follow the advice of your health care provider. ? Water from ice chips you suck on. ? Fruit juice with water you add to it (diluted). ? Sports drinks. ? Hot or cold herbal teas. ? Broth-based soups. ? Milk or milk products. Food Follow instructions from your health care provider about what to eat while you rehydrate. Your health care provider may recommend that you slowly begin eating regular foods in small amounts.  Eat foods that contain a healthy balance of electrolytes, such as bananas, oranges, potatoes, tomatoes, and spinach.  Avoid foods that are greasy or contain a lot of sugar. In some cases, you may get nutrition through a feeding tube that is passed through your nose and into your stomach (nasogastric tube, or NG tube). This may be done if you have uncontrolled vomiting or diarrhea.   Beverages to avoid Certain beverages may make dehydration worse. While you rehydrate, avoid drinking alcohol.   How to tell if you are recovering from dehydration You may be recovering from dehydration if:  You are urinating more often than before you started rehydrating.  Your urine is pale yellow.  Your energy level   improves.  You vomit less frequently.  You have diarrhea less frequently.  Your appetite improves or returns to normal.  You feel less dizzy or less light-headed.  Your skin tone and color start to look more normal. Follow these instructions at home:  Take over-the-counter and prescription medicines only  as told by your health care provider.  Do not take sodium tablets. Doing this can lead to having too much sodium in your body (hypernatremia). Contact a health care provider if:  You continue to have symptoms of mild or moderate dehydration, such as: ? Thirst. ? Dry lips. ? Slightly dry mouth. ? Dizziness. ? Dark urine or less urine than normal. ? Muscle cramps.  You continue to vomit or have diarrhea. Get help right away if you:  Have symptoms of dehydration that get worse.  Have a fever.  Have a severe headache.  Have been vomiting and the following happens: ? Your vomiting gets worse or does not go away. ? Your vomit includes blood or green matter (bile). ? You cannot eat or drink without vomiting.  Have problems with urination or bowel movements, such as: ? Diarrhea that gets worse or does not go away. ? Blood in your stool (feces). This may cause stool to look black and tarry. ? Not urinating, or urinating only a small amount of very dark urine, within 6-8 hours.  Have trouble breathing.  Have symptoms that get worse with treatment. These symptoms may represent a serious problem that is an emergency. Do not wait to see if the symptoms will go away. Get medical help right away. Call your local emergency services (911 in the [place] Do not drive yourself to the hospital. Summary  Rehydration is the replacement of body fluids and minerals (electrolytes) that are lost during dehydration.  Follow instructions from your health care provider for rehydration. The kind of fluid and amount you should drink depend on your condition.  Slowly increase how much you drink until you have taken the amount recommended by your health care provider.  Contact your health care provider if you continue to show signs of mild or moderate dehydration. This information is not intended to replace advice given to you by your health care provider. Make sure you discuss any questions you have with  your health care provider. Document Revised: 06/25/2019 Document Reviewed: 05/05/2019 Elsevier Patient Education  2021 Elsevier Inc.  

## 2020-06-16 DIAGNOSIS — M9903 Segmental and somatic dysfunction of lumbar region: Secondary | ICD-10-CM | POA: Diagnosis not present

## 2020-06-16 DIAGNOSIS — M5116 Intervertebral disc disorders with radiculopathy, lumbar region: Secondary | ICD-10-CM | POA: Diagnosis not present

## 2020-06-17 ENCOUNTER — Other Ambulatory Visit: Payer: Self-pay

## 2020-06-17 ENCOUNTER — Inpatient Hospital Stay: Payer: Medicare Other

## 2020-06-17 VITALS — BP 143/83 | HR 78 | Temp 97.9°F | Resp 18

## 2020-06-17 DIAGNOSIS — C320 Malignant neoplasm of glottis: Secondary | ICD-10-CM

## 2020-06-17 MED ORDER — SODIUM CHLORIDE 0.9 % IV SOLN
Freq: Once | INTRAVENOUS | Status: AC
  Filled 2020-06-17: qty 250

## 2020-06-17 NOTE — Patient Instructions (Signed)
Rehydration, Adult Rehydration is the replacement of body fluids, salts, and minerals (electrolytes) that are lost during dehydration. Dehydration is when there is not enough water or other fluids in the body. This happens when you lose more fluids than you take in. Common causes of dehydration include:  Not drinking enough fluids. This can occur when you are ill or doing activities that require a lot of energy, especially in hot weather.  Conditions that cause loss of water or other fluids, such as diarrhea, vomiting, sweating, or urinating a lot.  Other illnesses, such as fever or infection.  Certain medicines, such as those that remove excess fluid from the body (diuretics). Symptoms of mild or moderate dehydration may include thirst, dry lips and mouth, and dizziness. Symptoms of severe dehydration may include increased heart rate, confusion, fainting, and not urinating. For severe dehydration, you may need to get fluids through an IV at the hospital. For mild or moderate dehydration, you can usually rehydrate at home by drinking certain fluids as told by your health care provider. What are the risks? Generally, rehydration is safe. However, taking in too much fluid (overhydration) can be a problem. This is rare. Overhydration can cause an electrolyte imbalance, kidney failure, or a decrease in salt (sodium) levels in the body. Supplies needed You will need an oral rehydration solution (ORS) if your health care provider tells you to use one. This is a drink to treat dehydration. It can be found in pharmacies and retail stores. How to rehydrate Fluids Follow instructions from your health care provider for rehydration. The kind of fluid and the amount you should drink depend on your condition. In general, you should choose drinks that you prefer.  If told by your health care provider, drink an ORS. ? Make an ORS by following instructions on the package. ? Start by drinking small amounts,  about  cup (120 mL) every 5-10 minutes. ? Slowly increase how much you drink until you have taken the amount recommended by your health care provider.  Drink enough clear fluids to keep your urine pale yellow. If you were told to drink an ORS, finish it first, then start slowly drinking other clear fluids. Drink fluids such as: ? Water. This includes sparkling water and flavored water. Drinking only water can lead to having too little sodium in your body (hyponatremia). Follow the advice of your health care provider. ? Water from ice chips you suck on. ? Fruit juice with water you add to it (diluted). ? Sports drinks. ? Hot or cold herbal teas. ? Broth-based soups. ? Milk or milk products. Food Follow instructions from your health care provider about what to eat while you rehydrate. Your health care provider may recommend that you slowly begin eating regular foods in small amounts.  Eat foods that contain a healthy balance of electrolytes, such as bananas, oranges, potatoes, tomatoes, and spinach.  Avoid foods that are greasy or contain a lot of sugar. In some cases, you may get nutrition through a feeding tube that is passed through your nose and into your stomach (nasogastric tube, or NG tube). This may be done if you have uncontrolled vomiting or diarrhea.   Beverages to avoid Certain beverages may make dehydration worse. While you rehydrate, avoid drinking alcohol.   How to tell if you are recovering from dehydration You may be recovering from dehydration if:  You are urinating more often than before you started rehydrating.  Your urine is pale yellow.  Your energy level   improves.  You vomit less frequently.  You have diarrhea less frequently.  Your appetite improves or returns to normal.  You feel less dizzy or less light-headed.  Your skin tone and color start to look more normal. Follow these instructions at home:  Take over-the-counter and prescription medicines only  as told by your health care provider.  Do not take sodium tablets. Doing this can lead to having too much sodium in your body (hypernatremia). Contact a health care provider if:  You continue to have symptoms of mild or moderate dehydration, such as: ? Thirst. ? Dry lips. ? Slightly dry mouth. ? Dizziness. ? Dark urine or less urine than normal. ? Muscle cramps.  You continue to vomit or have diarrhea. Get help right away if you:  Have symptoms of dehydration that get worse.  Have a fever.  Have a severe headache.  Have been vomiting and the following happens: ? Your vomiting gets worse or does not go away. ? Your vomit includes blood or green matter (bile). ? You cannot eat or drink without vomiting.  Have problems with urination or bowel movements, such as: ? Diarrhea that gets worse or does not go away. ? Blood in your stool (feces). This may cause stool to look black and tarry. ? Not urinating, or urinating only a small amount of very dark urine, within 6-8 hours.  Have trouble breathing.  Have symptoms that get worse with treatment. These symptoms may represent a serious problem that is an emergency. Do not wait to see if the symptoms will go away. Get medical help right away. Call your local emergency services (911 in the [place] Do not drive yourself to the hospital. Summary  Rehydration is the replacement of body fluids and minerals (electrolytes) that are lost during dehydration.  Follow instructions from your health care provider for rehydration. The kind of fluid and amount you should drink depend on your condition.  Slowly increase how much you drink until you have taken the amount recommended by your health care provider.  Contact your health care provider if you continue to show signs of mild or moderate dehydration. This information is not intended to replace advice given to you by your health care provider. Make sure you discuss any questions you have with  your health care provider. Document Revised: 06/25/2019 Document Reviewed: 05/05/2019 Elsevier Patient Education  2021 Elsevier Inc.  

## 2020-06-21 ENCOUNTER — Encounter: Payer: Self-pay | Admitting: Radiation Oncology

## 2020-06-22 ENCOUNTER — Inpatient Hospital Stay: Payer: Medicare Other

## 2020-06-22 ENCOUNTER — Other Ambulatory Visit: Payer: Self-pay

## 2020-06-22 VITALS — BP 126/71 | HR 77 | Temp 97.5°F | Resp 16

## 2020-06-22 DIAGNOSIS — C320 Malignant neoplasm of glottis: Secondary | ICD-10-CM | POA: Diagnosis not present

## 2020-06-22 MED ORDER — SODIUM CHLORIDE 0.9 % IV SOLN
INTRAVENOUS | Status: DC
  Filled 2020-06-22: qty 250

## 2020-06-22 NOTE — Patient Instructions (Signed)
Rehydration, Adult Rehydration is the replacement of body fluids, salts, and minerals (electrolytes) that are lost during dehydration. Dehydration is when there is not enough water or other fluids in the body. This happens when you lose more fluids than you take in. Common causes of dehydration include:  Not drinking enough fluids. This can occur when you are ill or doing activities that require a lot of energy, especially in hot weather.  Conditions that cause loss of water or other fluids, such as diarrhea, vomiting, sweating, or urinating a lot.  Other illnesses, such as fever or infection.  Certain medicines, such as those that remove excess fluid from the body (diuretics). Symptoms of mild or moderate dehydration may include thirst, dry lips and mouth, and dizziness. Symptoms of severe dehydration may include increased heart rate, confusion, fainting, and not urinating. For severe dehydration, you may need to get fluids through an IV at the hospital. For mild or moderate dehydration, you can usually rehydrate at home by drinking certain fluids as told by your health care provider. What are the risks? Generally, rehydration is safe. However, taking in too much fluid (overhydration) can be a problem. This is rare. Overhydration can cause an electrolyte imbalance, kidney failure, or a decrease in salt (sodium) levels in the body. Supplies needed You will need an oral rehydration solution (ORS) if your health care provider tells you to use one. This is a drink to treat dehydration. It can be found in pharmacies and retail stores. How to rehydrate Fluids Follow instructions from your health care provider for rehydration. The kind of fluid and the amount you should drink depend on your condition. In general, you should choose drinks that you prefer.  If told by your health care provider, drink an ORS. ? Make an ORS by following instructions on the package. ? Start by drinking small amounts,  about  cup (120 mL) every 5-10 minutes. ? Slowly increase how much you drink until you have taken the amount recommended by your health care provider.  Drink enough clear fluids to keep your urine pale yellow. If you were told to drink an ORS, finish it first, then start slowly drinking other clear fluids. Drink fluids such as: ? Water. This includes sparkling water and flavored water. Drinking only water can lead to having too little sodium in your body (hyponatremia). Follow the advice of your health care provider. ? Water from ice chips you suck on. ? Fruit juice with water you add to it (diluted). ? Sports drinks. ? Hot or cold herbal teas. ? Broth-based soups. ? Milk or milk products. Food Follow instructions from your health care provider about what to eat while you rehydrate. Your health care provider may recommend that you slowly begin eating regular foods in small amounts.  Eat foods that contain a healthy balance of electrolytes, such as bananas, oranges, potatoes, tomatoes, and spinach.  Avoid foods that are greasy or contain a lot of sugar. In some cases, you may get nutrition through a feeding tube that is passed through your nose and into your stomach (nasogastric tube, or NG tube). This may be done if you have uncontrolled vomiting or diarrhea.   Beverages to avoid Certain beverages may make dehydration worse. While you rehydrate, avoid drinking alcohol.   How to tell if you are recovering from dehydration You may be recovering from dehydration if:  You are urinating more often than before you started rehydrating.  Your urine is pale yellow.  Your energy level   improves.  You vomit less frequently.  You have diarrhea less frequently.  Your appetite improves or returns to normal.  You feel less dizzy or less light-headed.  Your skin tone and color start to look more normal. Follow these instructions at home:  Take over-the-counter and prescription medicines only  as told by your health care provider.  Do not take sodium tablets. Doing this can lead to having too much sodium in your body (hypernatremia). Contact a health care provider if:  You continue to have symptoms of mild or moderate dehydration, such as: ? Thirst. ? Dry lips. ? Slightly dry mouth. ? Dizziness. ? Dark urine or less urine than normal. ? Muscle cramps.  You continue to vomit or have diarrhea. Get help right away if you:  Have symptoms of dehydration that get worse.  Have a fever.  Have a severe headache.  Have been vomiting and the following happens: ? Your vomiting gets worse or does not go away. ? Your vomit includes blood or green matter (bile). ? You cannot eat or drink without vomiting.  Have problems with urination or bowel movements, such as: ? Diarrhea that gets worse or does not go away. ? Blood in your stool (feces). This may cause stool to look black and tarry. ? Not urinating, or urinating only a small amount of very dark urine, within 6-8 hours.  Have trouble breathing.  Have symptoms that get worse with treatment. These symptoms may represent a serious problem that is an emergency. Do not wait to see if the symptoms will go away. Get medical help right away. Call your local emergency services (911 in the [place] Do not drive yourself to the hospital. Summary  Rehydration is the replacement of body fluids and minerals (electrolytes) that are lost during dehydration.  Follow instructions from your health care provider for rehydration. The kind of fluid and amount you should drink depend on your condition.  Slowly increase how much you drink until you have taken the amount recommended by your health care provider.  Contact your health care provider if you continue to show signs of mild or moderate dehydration. This information is not intended to replace advice given to you by your health care provider. Make sure you discuss any questions you have with  your health care provider. Document Revised: 06/25/2019 Document Reviewed: 05/05/2019 Elsevier Patient Education  2021 Elsevier Inc.  

## 2020-06-22 NOTE — Progress Notes (Signed)
Nutrition Follow-up:  Patient with stage 1 glottis vocal cord cancer.  Patient completed radiation on 1/18.    Met with patient during IV fluids.  Patient reports that taste is slowly coming back. Swallowing is better.  Yesterday able to eat chicken and dumplings with broccoli, carrots and cauliflower and oatmeal.  Patient is not drinking Cookout milkshakes as says they are too high in fat.  Patient is drinking a protein shake at times, not daily but can't remember the name of it.  Reports that he likes to eat about 5 times per day.      Medications: reviewed  Labs: reviewed  Anthropometrics:   236 lb on 2/8 decreased from 242 lb on 1/18 238 lb on 11/16 243 lb on 12/10   NUTRITION DIAGNOSIS: Inadequate oral intake stable   INTERVENTION:  Encouraged patient to continue to consume good sources of protein for healing/recovery after treatment.   Continue protein drinks.   Patient has contact information.  RD available as needed   NEXT VISIT: no follow-up, available as needed  Sister Carbone B. Zenia Resides, [place], [place] Registered Dietitian [phone] (mobile)

## 2020-06-24 ENCOUNTER — Inpatient Hospital Stay: Payer: Medicare Other

## 2020-06-24 ENCOUNTER — Other Ambulatory Visit: Payer: Self-pay

## 2020-06-24 VITALS — BP 101/63 | HR 63 | Temp 97.8°F | Resp 18 | Wt 237.5 lb

## 2020-06-24 DIAGNOSIS — C320 Malignant neoplasm of glottis: Secondary | ICD-10-CM

## 2020-06-24 MED ORDER — SODIUM CHLORIDE 0.9 % IV SOLN
Freq: Once | INTRAVENOUS | Status: AC
  Filled 2020-06-24: qty 250

## 2020-06-24 NOTE — Patient Instructions (Signed)
Rehydration, Adult Rehydration is the replacement of body fluids, salts, and minerals (electrolytes) that are lost during dehydration. Dehydration is when there is not enough water or other fluids in the body. This happens when you lose more fluids than you take in. Common causes of dehydration include:  Not drinking enough fluids. This can occur when you are ill or doing activities that require a lot of energy, especially in hot weather.  Conditions that cause loss of water or other fluids, such as diarrhea, vomiting, sweating, or urinating a lot.  Other illnesses, such as fever or infection.  Certain medicines, such as those that remove excess fluid from the body (diuretics). Symptoms of mild or moderate dehydration may include thirst, dry lips and mouth, and dizziness. Symptoms of severe dehydration may include increased heart rate, confusion, fainting, and not urinating. For severe dehydration, you may need to get fluids through an IV at the hospital. For mild or moderate dehydration, you can usually rehydrate at home by drinking certain fluids as told by your health care provider. What are the risks? Generally, rehydration is safe. However, taking in too much fluid (overhydration) can be a problem. This is rare. Overhydration can cause an electrolyte imbalance, kidney failure, or a decrease in salt (sodium) levels in the body. Supplies needed You will need an oral rehydration solution (ORS) if your health care provider tells you to use one. This is a drink to treat dehydration. It can be found in pharmacies and retail stores. How to rehydrate Fluids Follow instructions from your health care provider for rehydration. The kind of fluid and the amount you should drink depend on your condition. In general, you should choose drinks that you prefer.  If told by your health care provider, drink an ORS. ? Make an ORS by following instructions on the package. ? Start by drinking small amounts,  about  cup (120 mL) every 5-10 minutes. ? Slowly increase how much you drink until you have taken the amount recommended by your health care provider.  Drink enough clear fluids to keep your urine pale yellow. If you were told to drink an ORS, finish it first, then start slowly drinking other clear fluids. Drink fluids such as: ? Water. This includes sparkling water and flavored water. Drinking only water can lead to having too little sodium in your body (hyponatremia). Follow the advice of your health care provider. ? Water from ice chips you suck on. ? Fruit juice with water you add to it (diluted). ? Sports drinks. ? Hot or cold herbal teas. ? Broth-based soups. ? Milk or milk products. Food Follow instructions from your health care provider about what to eat while you rehydrate. Your health care provider may recommend that you slowly begin eating regular foods in small amounts.  Eat foods that contain a healthy balance of electrolytes, such as bananas, oranges, potatoes, tomatoes, and spinach.  Avoid foods that are greasy or contain a lot of sugar. In some cases, you may get nutrition through a feeding tube that is passed through your nose and into your stomach (nasogastric tube, or NG tube). This may be done if you have uncontrolled vomiting or diarrhea.   Beverages to avoid Certain beverages may make dehydration worse. While you rehydrate, avoid drinking alcohol.   How to tell if you are recovering from dehydration You may be recovering from dehydration if:  You are urinating more often than before you started rehydrating.  Your urine is pale yellow.  Your energy level   improves.  You vomit less frequently.  You have diarrhea less frequently.  Your appetite improves or returns to normal.  You feel less dizzy or less light-headed.  Your skin tone and color start to look more normal. Follow these instructions at home:  Take over-the-counter and prescription medicines only  as told by your health care provider.  Do not take sodium tablets. Doing this can lead to having too much sodium in your body (hypernatremia). Contact a health care provider if:  You continue to have symptoms of mild or moderate dehydration, such as: ? Thirst. ? Dry lips. ? Slightly dry mouth. ? Dizziness. ? Dark urine or less urine than normal. ? Muscle cramps.  You continue to vomit or have diarrhea. Get help right away if you:  Have symptoms of dehydration that get worse.  Have a fever.  Have a severe headache.  Have been vomiting and the following happens: ? Your vomiting gets worse or does not go away. ? Your vomit includes blood or green matter (bile). ? You cannot eat or drink without vomiting.  Have problems with urination or bowel movements, such as: ? Diarrhea that gets worse or does not go away. ? Blood in your stool (feces). This may cause stool to look black and tarry. ? Not urinating, or urinating only a small amount of very dark urine, within 6-8 hours.  Have trouble breathing.  Have symptoms that get worse with treatment. These symptoms may represent a serious problem that is an emergency. Do not wait to see if the symptoms will go away. Get medical help right away. Call your local emergency services (911 in the [place] Do not drive yourself to the hospital. Summary  Rehydration is the replacement of body fluids and minerals (electrolytes) that are lost during dehydration.  Follow instructions from your health care provider for rehydration. The kind of fluid and amount you should drink depend on your condition.  Slowly increase how much you drink until you have taken the amount recommended by your health care provider.  Contact your health care provider if you continue to show signs of mild or moderate dehydration. This information is not intended to replace advice given to you by your health care provider. Make sure you discuss any questions you have with  your health care provider. Document Revised: 06/25/2019 Document Reviewed: 05/05/2019 Elsevier Patient Education  2021 Elsevier Inc.  

## 2020-06-29 ENCOUNTER — Other Ambulatory Visit: Payer: Self-pay

## 2020-06-29 ENCOUNTER — Inpatient Hospital Stay: Payer: Medicare Other

## 2020-06-29 VITALS — BP 120/81 | HR 70 | Temp 98.0°F | Resp 18

## 2020-06-29 DIAGNOSIS — C320 Malignant neoplasm of glottis: Secondary | ICD-10-CM

## 2020-06-29 MED ORDER — SODIUM CHLORIDE 0.9 % IV SOLN
Freq: Once | INTRAVENOUS | Status: DC
  Filled 2020-06-29: qty 250

## 2020-06-29 MED ORDER — SODIUM CHLORIDE 0.9 % IV SOLN
Freq: Once | INTRAVENOUS | Status: AC
  Filled 2020-06-29: qty 250

## 2020-06-29 NOTE — Patient Instructions (Signed)
Rehydration, Adult Rehydration is the replacement of body fluids, salts, and minerals (electrolytes) that are lost during dehydration. Dehydration is when there is not enough water or other fluids in the body. This happens when you lose more fluids than you take in. Common causes of dehydration include:  Not drinking enough fluids. This can occur when you are ill or doing activities that require a lot of energy, especially in hot weather.  Conditions that cause loss of water or other fluids, such as diarrhea, vomiting, sweating, or urinating a lot.  Other illnesses, such as fever or infection.  Certain medicines, such as those that remove excess fluid from the body (diuretics). Symptoms of mild or moderate dehydration may include thirst, dry lips and mouth, and dizziness. Symptoms of severe dehydration may include increased heart rate, confusion, fainting, and not urinating. For severe dehydration, you may need to get fluids through an IV at the hospital. For mild or moderate dehydration, you can usually rehydrate at home by drinking certain fluids as told by your health care provider. What are the risks? Generally, rehydration is safe. However, taking in too much fluid (overhydration) can be a problem. This is rare. Overhydration can cause an electrolyte imbalance, kidney failure, or a decrease in salt (sodium) levels in the body. Supplies needed You will need an oral rehydration solution (ORS) if your health care provider tells you to use one. This is a drink to treat dehydration. It can be found in pharmacies and retail stores. How to rehydrate Fluids Follow instructions from your health care provider for rehydration. The kind of fluid and the amount you should drink depend on your condition. In general, you should choose drinks that you prefer.  If told by your health care provider, drink an ORS. ? Make an ORS by following instructions on the package. ? Start by drinking small amounts,  about  cup (120 mL) every 5-10 minutes. ? Slowly increase how much you drink until you have taken the amount recommended by your health care provider.  Drink enough clear fluids to keep your urine pale yellow. If you were told to drink an ORS, finish it first, then start slowly drinking other clear fluids. Drink fluids such as: ? Water. This includes sparkling water and flavored water. Drinking only water can lead to having too little sodium in your body (hyponatremia). Follow the advice of your health care provider. ? Water from ice chips you suck on. ? Fruit juice with water you add to it (diluted). ? Sports drinks. ? Hot or cold herbal teas. ? Broth-based soups. ? Milk or milk products. Food Follow instructions from your health care provider about what to eat while you rehydrate. Your health care provider may recommend that you slowly begin eating regular foods in small amounts.  Eat foods that contain a healthy balance of electrolytes, such as bananas, oranges, potatoes, tomatoes, and spinach.  Avoid foods that are greasy or contain a lot of sugar. In some cases, you may get nutrition through a feeding tube that is passed through your nose and into your stomach (nasogastric tube, or NG tube). This may be done if you have uncontrolled vomiting or diarrhea.   Beverages to avoid Certain beverages may make dehydration worse. While you rehydrate, avoid drinking alcohol.   How to tell if you are recovering from dehydration You may be recovering from dehydration if:  You are urinating more often than before you started rehydrating.  Your urine is pale yellow.  Your energy level   improves.  You vomit less frequently.  You have diarrhea less frequently.  Your appetite improves or returns to normal.  You feel less dizzy or less light-headed.  Your skin tone and color start to look more normal. Follow these instructions at home:  Take over-the-counter and prescription medicines only  as told by your health care provider.  Do not take sodium tablets. Doing this can lead to having too much sodium in your body (hypernatremia). Contact a health care provider if:  You continue to have symptoms of mild or moderate dehydration, such as: ? Thirst. ? Dry lips. ? Slightly dry mouth. ? Dizziness. ? Dark urine or less urine than normal. ? Muscle cramps.  You continue to vomit or have diarrhea. Get help right away if you:  Have symptoms of dehydration that get worse.  Have a fever.  Have a severe headache.  Have been vomiting and the following happens: ? Your vomiting gets worse or does not go away. ? Your vomit includes blood or green matter (bile). ? You cannot eat or drink without vomiting.  Have problems with urination or bowel movements, such as: ? Diarrhea that gets worse or does not go away. ? Blood in your stool (feces). This may cause stool to look black and tarry. ? Not urinating, or urinating only a small amount of very dark urine, within 6-8 hours.  Have trouble breathing.  Have symptoms that get worse with treatment. These symptoms may represent a serious problem that is an emergency. Do not wait to see if the symptoms will go away. Get medical help right away. Call your local emergency services (911 in the [place] Do not drive yourself to the hospital. Summary  Rehydration is the replacement of body fluids and minerals (electrolytes) that are lost during dehydration.  Follow instructions from your health care provider for rehydration. The kind of fluid and amount you should drink depend on your condition.  Slowly increase how much you drink until you have taken the amount recommended by your health care provider.  Contact your health care provider if you continue to show signs of mild or moderate dehydration. This information is not intended to replace advice given to you by your health care provider. Make sure you discuss any questions you have with  your health care provider. Document Revised: 06/25/2019 Document Reviewed: 05/05/2019 Elsevier Patient Education  2021 Elsevier Inc.  

## 2020-06-30 DIAGNOSIS — M9903 Segmental and somatic dysfunction of lumbar region: Secondary | ICD-10-CM | POA: Diagnosis not present

## 2020-06-30 DIAGNOSIS — M5116 Intervertebral disc disorders with radiculopathy, lumbar region: Secondary | ICD-10-CM | POA: Diagnosis not present

## 2020-07-01 ENCOUNTER — Inpatient Hospital Stay: Payer: Medicare Other

## 2020-07-01 ENCOUNTER — Other Ambulatory Visit: Payer: Self-pay

## 2020-07-01 VITALS — BP 121/79 | HR 82 | Temp 97.8°F | Resp 18

## 2020-07-01 DIAGNOSIS — C320 Malignant neoplasm of glottis: Secondary | ICD-10-CM | POA: Diagnosis not present

## 2020-07-01 MED ORDER — SODIUM CHLORIDE 0.9 % IV SOLN
INTRAVENOUS | Status: AC
  Filled 2020-07-01 (×2): qty 250

## 2020-07-01 NOTE — Patient Instructions (Signed)
Rehydration, Adult Rehydration is the replacement of body fluids, salts, and minerals (electrolytes) that are lost during dehydration. Dehydration is when there is not enough water or other fluids in the body. This happens when you lose more fluids than you take in. Common causes of dehydration include:  Not drinking enough fluids. This can occur when you are ill or doing activities that require a lot of energy, especially in hot weather.  Conditions that cause loss of water or other fluids, such as diarrhea, vomiting, sweating, or urinating a lot.  Other illnesses, such as fever or infection.  Certain medicines, such as those that remove excess fluid from the body (diuretics). Symptoms of mild or moderate dehydration may include thirst, dry lips and mouth, and dizziness. Symptoms of severe dehydration may include increased heart rate, confusion, fainting, and not urinating. For severe dehydration, you may need to get fluids through an IV at the hospital. For mild or moderate dehydration, you can usually rehydrate at home by drinking certain fluids as told by your health care provider. What are the risks? Generally, rehydration is safe. However, taking in too much fluid (overhydration) can be a problem. This is rare. Overhydration can cause an electrolyte imbalance, kidney failure, or a decrease in salt (sodium) levels in the body. Supplies needed You will need an oral rehydration solution (ORS) if your health care provider tells you to use one. This is a drink to treat dehydration. It can be found in pharmacies and retail stores. How to rehydrate Fluids Follow instructions from your health care provider for rehydration. The kind of fluid and the amount you should drink depend on your condition. In general, you should choose drinks that you prefer.  If told by your health care provider, drink an ORS. ? Make an ORS by following instructions on the package. ? Start by drinking small amounts,  about  cup (120 mL) every 5-10 minutes. ? Slowly increase how much you drink until you have taken the amount recommended by your health care provider.  Drink enough clear fluids to keep your urine pale yellow. If you were told to drink an ORS, finish it first, then start slowly drinking other clear fluids. Drink fluids such as: ? Water. This includes sparkling water and flavored water. Drinking only water can lead to having too little sodium in your body (hyponatremia). Follow the advice of your health care provider. ? Water from ice chips you suck on. ? Fruit juice with water you add to it (diluted). ? Sports drinks. ? Hot or cold herbal teas. ? Broth-based soups. ? Milk or milk products. Food Follow instructions from your health care provider about what to eat while you rehydrate. Your health care provider may recommend that you slowly begin eating regular foods in small amounts.  Eat foods that contain a healthy balance of electrolytes, such as bananas, oranges, potatoes, tomatoes, and spinach.  Avoid foods that are greasy or contain a lot of sugar. In some cases, you may get nutrition through a feeding tube that is passed through your nose and into your stomach (nasogastric tube, or NG tube). This may be done if you have uncontrolled vomiting or diarrhea.   Beverages to avoid Certain beverages may make dehydration worse. While you rehydrate, avoid drinking alcohol.   How to tell if you are recovering from dehydration You may be recovering from dehydration if:  You are urinating more often than before you started rehydrating.  Your urine is pale yellow.  Your energy level   improves.  You vomit less frequently.  You have diarrhea less frequently.  Your appetite improves or returns to normal.  You feel less dizzy or less light-headed.  Your skin tone and color start to look more normal. Follow these instructions at home:  Take over-the-counter and prescription medicines only  as told by your health care provider.  Do not take sodium tablets. Doing this can lead to having too much sodium in your body (hypernatremia). Contact a health care provider if:  You continue to have symptoms of mild or moderate dehydration, such as: ? Thirst. ? Dry lips. ? Slightly dry mouth. ? Dizziness. ? Dark urine or less urine than normal. ? Muscle cramps.  You continue to vomit or have diarrhea. Get help right away if you:  Have symptoms of dehydration that get worse.  Have a fever.  Have a severe headache.  Have been vomiting and the following happens: ? Your vomiting gets worse or does not go away. ? Your vomit includes blood or green matter (bile). ? You cannot eat or drink without vomiting.  Have problems with urination or bowel movements, such as: ? Diarrhea that gets worse or does not go away. ? Blood in your stool (feces). This may cause stool to look black and tarry. ? Not urinating, or urinating only a small amount of very dark urine, within 6-8 hours.  Have trouble breathing.  Have symptoms that get worse with treatment. These symptoms may represent a serious problem that is an emergency. Do not wait to see if the symptoms will go away. Get medical help right away. Call your local emergency services (911 in the [place] Do not drive yourself to the hospital. Summary  Rehydration is the replacement of body fluids and minerals (electrolytes) that are lost during dehydration.  Follow instructions from your health care provider for rehydration. The kind of fluid and amount you should drink depend on your condition.  Slowly increase how much you drink until you have taken the amount recommended by your health care provider.  Contact your health care provider if you continue to show signs of mild or moderate dehydration. This information is not intended to replace advice given to you by your health care provider. Make sure you discuss any questions you have with  your health care provider. Document Revised: 06/25/2019 Document Reviewed: 05/05/2019 Elsevier Patient Education  2021 Elsevier Inc.  

## 2020-07-06 ENCOUNTER — Inpatient Hospital Stay: Payer: Medicare Other | Attending: Radiation Oncology

## 2020-07-06 ENCOUNTER — Other Ambulatory Visit: Payer: Self-pay

## 2020-07-06 VITALS — BP 140/88 | HR 72 | Temp 97.8°F | Resp 18

## 2020-07-06 DIAGNOSIS — C320 Malignant neoplasm of glottis: Secondary | ICD-10-CM | POA: Insufficient documentation

## 2020-07-06 DIAGNOSIS — E860 Dehydration: Secondary | ICD-10-CM | POA: Insufficient documentation

## 2020-07-06 MED ORDER — SODIUM CHLORIDE 0.9 % IV SOLN
INTRAVENOUS | Status: AC
  Filled 2020-07-06 (×2): qty 250

## 2020-07-06 NOTE — Patient Instructions (Signed)
Rehydration, Adult Rehydration is the replacement of body fluids, salts, and minerals (electrolytes) that are lost during dehydration. Dehydration is when there is not enough water or other fluids in the body. This happens when you lose more fluids than you take in. Common causes of dehydration include:  Not drinking enough fluids. This can occur when you are ill or doing activities that require a lot of energy, especially in hot weather.  Conditions that cause loss of water or other fluids, such as diarrhea, vomiting, sweating, or urinating a lot.  Other illnesses, such as fever or infection.  Certain medicines, such as those that remove excess fluid from the body (diuretics). Symptoms of mild or moderate dehydration may include thirst, dry lips and mouth, and dizziness. Symptoms of severe dehydration may include increased heart rate, confusion, fainting, and not urinating. For severe dehydration, you may need to get fluids through an IV at the hospital. For mild or moderate dehydration, you can usually rehydrate at home by drinking certain fluids as told by your health care provider. What are the risks? Generally, rehydration is safe. However, taking in too much fluid (overhydration) can be a problem. This is rare. Overhydration can cause an electrolyte imbalance, kidney failure, or a decrease in salt (sodium) levels in the body. Supplies needed You will need an oral rehydration solution (ORS) if your health care provider tells you to use one. This is a drink to treat dehydration. It can be found in pharmacies and retail stores. How to rehydrate Fluids Follow instructions from your health care provider for rehydration. The kind of fluid and the amount you should drink depend on your condition. In general, you should choose drinks that you prefer.  If told by your health care provider, drink an ORS. ? Make an ORS by following instructions on the package. ? Start by drinking small amounts,  about  cup (120 mL) every 5-10 minutes. ? Slowly increase how much you drink until you have taken the amount recommended by your health care provider.  Drink enough clear fluids to keep your urine pale yellow. If you were told to drink an ORS, finish it first, then start slowly drinking other clear fluids. Drink fluids such as: ? Water. This includes sparkling water and flavored water. Drinking only water can lead to having too little sodium in your body (hyponatremia). Follow the advice of your health care provider. ? Water from ice chips you suck on. ? Fruit juice with water you add to it (diluted). ? Sports drinks. ? Hot or cold herbal teas. ? Broth-based soups. ? Milk or milk products. Food Follow instructions from your health care provider about what to eat while you rehydrate. Your health care provider may recommend that you slowly begin eating regular foods in small amounts.  Eat foods that contain a healthy balance of electrolytes, such as bananas, oranges, potatoes, tomatoes, and spinach.  Avoid foods that are greasy or contain a lot of sugar. In some cases, you may get nutrition through a feeding tube that is passed through your nose and into your stomach (nasogastric tube, or NG tube). This may be done if you have uncontrolled vomiting or diarrhea.   Beverages to avoid Certain beverages may make dehydration worse. While you rehydrate, avoid drinking alcohol.   How to tell if you are recovering from dehydration You may be recovering from dehydration if:  You are urinating more often than before you started rehydrating.  Your urine is pale yellow.  Your energy level   improves.  You vomit less frequently.  You have diarrhea less frequently.  Your appetite improves or returns to normal.  You feel less dizzy or less light-headed.  Your skin tone and color start to look more normal. Follow these instructions at home:  Take over-the-counter and prescription medicines only  as told by your health care provider.  Do not take sodium tablets. Doing this can lead to having too much sodium in your body (hypernatremia). Contact a health care provider if:  You continue to have symptoms of mild or moderate dehydration, such as: ? Thirst. ? Dry lips. ? Slightly dry mouth. ? Dizziness. ? Dark urine or less urine than normal. ? Muscle cramps.  You continue to vomit or have diarrhea. Get help right away if you:  Have symptoms of dehydration that get worse.  Have a fever.  Have a severe headache.  Have been vomiting and the following happens: ? Your vomiting gets worse or does not go away. ? Your vomit includes blood or green matter (bile). ? You cannot eat or drink without vomiting.  Have problems with urination or bowel movements, such as: ? Diarrhea that gets worse or does not go away. ? Blood in your stool (feces). This may cause stool to look black and tarry. ? Not urinating, or urinating only a small amount of very dark urine, within 6-8 hours.  Have trouble breathing.  Have symptoms that get worse with treatment. These symptoms may represent a serious problem that is an emergency. Do not wait to see if the symptoms will go away. Get medical help right away. Call your local emergency services (911 in the [place] Do not drive yourself to the hospital. Summary  Rehydration is the replacement of body fluids and minerals (electrolytes) that are lost during dehydration.  Follow instructions from your health care provider for rehydration. The kind of fluid and amount you should drink depend on your condition.  Slowly increase how much you drink until you have taken the amount recommended by your health care provider.  Contact your health care provider if you continue to show signs of mild or moderate dehydration. This information is not intended to replace advice given to you by your health care provider. Make sure you discuss any questions you have with  your health care provider. Document Revised: 06/25/2019 Document Reviewed: 05/05/2019 Elsevier Patient Education  2021 Elsevier Inc.  

## 2020-07-08 ENCOUNTER — Other Ambulatory Visit: Payer: Self-pay

## 2020-07-08 ENCOUNTER — Inpatient Hospital Stay: Payer: Medicare Other

## 2020-07-08 VITALS — BP 128/78 | HR 78

## 2020-07-08 DIAGNOSIS — C320 Malignant neoplasm of glottis: Secondary | ICD-10-CM | POA: Diagnosis not present

## 2020-07-08 DIAGNOSIS — E860 Dehydration: Secondary | ICD-10-CM | POA: Diagnosis not present

## 2020-07-08 MED ORDER — SODIUM CHLORIDE 0.9 % IV SOLN
INTRAVENOUS | Status: AC
  Filled 2020-07-08 (×2): qty 250

## 2020-07-08 NOTE — Progress Notes (Signed)
Pt discharged in no apparent distress. Pt left ambulatory without assistance. Pt aware of discharge instructions and verbalized understanding and had no further questions.  

## 2020-07-16 NOTE — Progress Notes (Signed)
Oncology Nurse Navigator Documentation  Jesse Sharp called me to report the sensation of having a lump in his throat. He completed radiation to his neck in January. At his request I called Dr. Benjamine Mola (ENT) to have an appointment scheduled for evaluation. He is scheduled for 3/14 at 1:10 pm and I left this appointment time and date on Mr. Hackler voice mail with instructions to call me or Dr. Deeann Saint office only if he cannot attend.   Harlow Asa RN, BSN, OCN Head & Neck Oncology Nurse [place] at [facility] Phone # [phone]  Fax # [phone]

## 2020-07-19 DIAGNOSIS — I10 Essential (primary) hypertension: Secondary | ICD-10-CM | POA: Diagnosis not present

## 2020-07-19 DIAGNOSIS — I700 Atherosclerosis of aorta: Secondary | ICD-10-CM | POA: Diagnosis not present

## 2020-07-19 DIAGNOSIS — E785 Hyperlipidemia, unspecified: Secondary | ICD-10-CM | POA: Diagnosis not present

## 2020-07-19 DIAGNOSIS — Z8521 Personal history of malignant neoplasm of larynx: Secondary | ICD-10-CM | POA: Diagnosis not present

## 2020-07-19 DIAGNOSIS — D692 Other nonthrombocytopenic purpura: Secondary | ICD-10-CM | POA: Diagnosis not present

## 2020-07-21 DIAGNOSIS — M5116 Intervertebral disc disorders with radiculopathy, lumbar region: Secondary | ICD-10-CM | POA: Diagnosis not present

## 2020-07-21 DIAGNOSIS — M9903 Segmental and somatic dysfunction of lumbar region: Secondary | ICD-10-CM | POA: Diagnosis not present

## 2020-07-23 ENCOUNTER — Other Ambulatory Visit: Payer: Self-pay | Admitting: Radiation Oncology

## 2020-07-23 DIAGNOSIS — C320 Malignant neoplasm of glottis: Secondary | ICD-10-CM

## 2020-07-23 MED ORDER — HYDROCODONE-ACETAMINOPHEN 7.5-325 MG/15ML PO SOLN: 10.0000 mL | ORAL | 0 refills | Status: DC | PRN

## 2020-07-27 DIAGNOSIS — E785 Hyperlipidemia, unspecified: Secondary | ICD-10-CM | POA: Diagnosis not present

## 2020-07-27 DIAGNOSIS — Z8521 Personal history of malignant neoplasm of larynx: Secondary | ICD-10-CM | POA: Diagnosis not present

## 2020-07-27 DIAGNOSIS — I10 Essential (primary) hypertension: Secondary | ICD-10-CM | POA: Diagnosis not present

## 2020-07-28 NOTE — Progress Notes (Signed)
  Patient Name: Jesse Sharp MRN: [id number] DOB: 12/15/1945 Referring Physician: Latanya Presser Date of Service: 06/21/2020 [facility], [place]                                                        End Of Treatment Note  Diagnoses: C32.0-Malignant neoplasm of glottis  Cancer Staging: Cancer Staging Malignant neoplasm of glottis [facility]) Staging form: Larynx - Glottis, AJCC 8th Edition - Clinical stage from 04/06/2020: Stage I (cT1b, cN0, cM0) - Signed by Eppie Gibson, MD on 04/07/2020 Stage prefix: Initial diagnosis  Intent: Curative  Radiation Treatment Dates: 04/26/2020 through 05/25/2020 Site Technique Total Dose (Gy) Dose per Fx (Gy) Completed Fx Beam Energies  Larynx: HN_larynx 3D 55/55 2.75 20/20 6X   Narrative: The patient tolerated radiation therapy relatively well.   Plan: The patient will follow-up with radiation oncology in 2-3 wks. -----------------------------------  Eppie Gibson, MD

## 2020-08-11 DIAGNOSIS — M9903 Segmental and somatic dysfunction of lumbar region: Secondary | ICD-10-CM | POA: Diagnosis not present

## 2020-08-11 DIAGNOSIS — M5116 Intervertebral disc disorders with radiculopathy, lumbar region: Secondary | ICD-10-CM | POA: Diagnosis not present

## 2020-09-01 DIAGNOSIS — M9903 Segmental and somatic dysfunction of lumbar region: Secondary | ICD-10-CM | POA: Diagnosis not present

## 2020-09-01 DIAGNOSIS — M5116 Intervertebral disc disorders with radiculopathy, lumbar region: Secondary | ICD-10-CM | POA: Diagnosis not present

## 2020-10-05 DIAGNOSIS — M9903 Segmental and somatic dysfunction of lumbar region: Secondary | ICD-10-CM | POA: Diagnosis not present

## 2020-10-05 DIAGNOSIS — M5116 Intervertebral disc disorders with radiculopathy, lumbar region: Secondary | ICD-10-CM | POA: Diagnosis not present

## 2020-10-18 DIAGNOSIS — G4733 Obstructive sleep apnea (adult) (pediatric): Secondary | ICD-10-CM | POA: Diagnosis not present

## 2020-10-18 DIAGNOSIS — Z23 Encounter for immunization: Secondary | ICD-10-CM | POA: Diagnosis not present

## 2020-10-18 DIAGNOSIS — R7303 Prediabetes: Secondary | ICD-10-CM | POA: Diagnosis not present

## 2020-10-18 DIAGNOSIS — Z8521 Personal history of malignant neoplasm of larynx: Secondary | ICD-10-CM | POA: Diagnosis not present

## 2020-10-18 DIAGNOSIS — I10 Essential (primary) hypertension: Secondary | ICD-10-CM | POA: Diagnosis not present

## 2020-10-18 DIAGNOSIS — E785 Hyperlipidemia, unspecified: Secondary | ICD-10-CM | POA: Diagnosis not present

## 2020-10-26 DIAGNOSIS — U071 COVID-19: Secondary | ICD-10-CM | POA: Diagnosis not present

## 2020-10-26 DIAGNOSIS — J209 Acute bronchitis, unspecified: Secondary | ICD-10-CM | POA: Diagnosis not present

## 2020-11-04 DIAGNOSIS — M9903 Segmental and somatic dysfunction of lumbar region: Secondary | ICD-10-CM | POA: Diagnosis not present

## 2020-11-04 DIAGNOSIS — M5116 Intervertebral disc disorders with radiculopathy, lumbar region: Secondary | ICD-10-CM | POA: Diagnosis not present

## 2020-11-11 ENCOUNTER — Telehealth: Payer: Self-pay | Admitting: *Deleted

## 2020-11-11 NOTE — Telephone Encounter (Signed)
CALLED PATIENT TO ASK ABOUT RESCHEDULING FU FOR 11-12-20, PATIENT AGREED TO COME ON 11-26-20 @ 10:20 AM

## 2020-11-12 ENCOUNTER — Ambulatory Visit: Payer: Medicare Other | Admitting: Radiation Oncology

## 2020-11-25 DIAGNOSIS — M9903 Segmental and somatic dysfunction of lumbar region: Secondary | ICD-10-CM | POA: Diagnosis not present

## 2020-11-25 DIAGNOSIS — M5116 Intervertebral disc disorders with radiculopathy, lumbar region: Secondary | ICD-10-CM | POA: Diagnosis not present

## 2020-11-26 ENCOUNTER — Ambulatory Visit
Admission: RE | Admit: 2020-11-26 | Discharge: 2020-11-26 | Disposition: A | Discharge status: Home / Self Care | Payer: Medicare Other | Source: Ambulatory Visit | Attending: Radiation Oncology | Admitting: Radiation Oncology

## 2020-11-26 ENCOUNTER — Encounter: Payer: Self-pay | Admitting: Radiation Oncology

## 2020-11-26 ENCOUNTER — Telehealth: Payer: Self-pay | Admitting: *Deleted

## 2020-11-26 ENCOUNTER — Other Ambulatory Visit: Payer: Self-pay

## 2020-11-26 VITALS — BP 117/65 | HR 81 | Temp 96.9°F | Resp 18 | Ht 76.0 in | Wt 232.6 lb

## 2020-11-26 DIAGNOSIS — Z8521 Personal history of malignant neoplasm of larynx: Secondary | ICD-10-CM | POA: Insufficient documentation

## 2020-11-26 DIAGNOSIS — Z08 Encounter for follow-up examination after completed treatment for malignant neoplasm: Secondary | ICD-10-CM | POA: Insufficient documentation

## 2020-11-26 DIAGNOSIS — C320 Malignant neoplasm of glottis: Secondary | ICD-10-CM

## 2020-11-26 MED ORDER — OXYMETAZOLINE HCL 0.05 % NA SOLN
2.0000 | Freq: Once | NASAL | Status: AC
  Administered 2020-11-26: 2 via NASAL
  Filled 2020-11-26: qty 30

## 2020-11-26 NOTE — Progress Notes (Signed)
[name] presents today for follow-up after completing radiation to his larynx on 05/25/2020  Pain issues, if any: Patient denies. Reports he hasn't needed his prescription medication for throat pain in over 3 months Using a feeding tube?: N/A Weight changes, if any:  Wt Readings from Last 3 Encounters:  11/26/20 233 lb 2 oz (105.7 kg)  06/24/20 237 lb 8 oz (107.7 kg)  06/15/20 236 lb 8 oz (107.3 kg)   Swallowing issues, if any: States as long as he takes his time and is mindful, he doesn't have any issues. Does have to avoid drier foods and bread products. Denies any issues with thin liquids Smoking or chewing tobacco? None Using fluoride trays daily? N/A Last ENT visit was on: 07/19/2020 Saw Dr. Leta Baptist  Other notable issues, if any: Reports feeling fatigued, but feels this is more related to his work/sleep schedule. Denies any lingering dry mouth, thick saliva, or taste changes. Denies any ear or jaw pain, or difficulty opening his mouth. Does report lingering tightness/mild swelling to his neck, but states he continues to do PT exercises and is adjusted by a chiropractor every 3 months.    Vitals:   11/26/20 1052  BP: 117/65  Pulse: 81  Resp: 18  Temp: (!) 96.9 F (36.1 C)  SpO2: 99%

## 2020-11-26 NOTE — Telephone Encounter (Signed)
CALLED PATIENT TO INFORM OF LAB ON 05-27-21 @ 1:30 PM AND HIS FU WITH DR. Isidore Moos ON 05/27/21 @ 2 PM, LVM FOR A RETURN CALL

## 2020-11-26 NOTE — Progress Notes (Signed)
Radiation Oncology         [phone]) [phone] ________________________________  Name: Jesse Sharp MRN: [id number]  Date: 11/26/2020  DOB: 11/27/1945  Follow-Up Visit Note  CC: Bakare, Larey Dresser, MD  Audley Hose, MD  Diagnosis and Prior Radiotherapy:       ICD-10-CM   1. Malignant neoplasm of glottis (HCC)  C32.0 oxymetazoline (AFRIN) 0.05 % nasal spray 2 spray    Fiberoptic laryngoscopy      Cancer Staging: Cancer Staging Malignant neoplasm of glottis (HCC) Staging form: Larynx - Glottis, AJCC 8th Edition - Clinical stage from 04/06/2020: Stage I (cT1b, cN0, cM0) - Signed by Eppie Gibson, MD on 04/07/2020 Stage prefix: Initial diagnosis     Radiation Treatment Dates: 04/26/2020 through 05/25/2020 Site Technique Total Dose (Gy) Dose per Fx (Gy) Completed Fx Beam Energies  Larynx: HN_larynx 3D 55/55 2.75 20/20 6X   CHIEF COMPLAINT:  Here for follow-up and surveillance of glottic cancer  Narrative:  Jesse Sharp presents today for follow-up after completing radiation to his larynx on 05/25/2020  Pain issues, if any: Patient denies. Reports he hasn't needed his prescription medication for throat pain in over 3 months Using a feeding tube?: N/A Weight changes, if any:  Wt Readings from Last 3 Encounters:  11/26/20 232 lb 9 oz (105.5 kg)  06/24/20 237 lb 8 oz (107.7 kg)  06/15/20 236 lb 8 oz (107.3 kg)   Swallowing issues, if any: States as long as he takes his time and is mindful, he doesn't have any issues. Does have to avoid drier foods and bread products. Denies any issues with thin liquids Smoking or chewing tobacco? None Using fluoride trays daily? N/A Last ENT visit was on: 07/19/2020 Saw Dr. Leta Baptist  Other notable issues, if any: Reports feeling fatigued, but feels this is more related to his work/sleep schedule. Denies any lingering dry mouth, thick saliva, or taste changes. Denies any ear or jaw pain, or difficulty opening his mouth. Does report lingering  tightness/mild swelling to his neck, but states he continues to do PT exercises and is adjusted by a chiropractor every 3 months.    Vitals:   11/26/20 1052  BP: 117/65  Pulse: 81  Resp: 18  Temp: (!) 96.9 F (36.1 C)  SpO2: 99%     ALLERGIES:  has No Known Allergies.  Meds: Current Outpatient Medications  Medication Sig Dispense Refill   ALPRAZolam (XANAX) 0.5 MG tablet Take 0.5 mg by mouth at bedtime as needed for anxiety.     HYDROcodone-acetaminophen (HYCET) 7.5-325 mg/15 ml solution Take 10-15 mLs by mouth every 4 (four) hours as needed for moderate pain. Take with food. 473 mL 0   lidocaine (XYLOCAINE) 2 % solution Patient: Mix 1part 2% viscous lidocaine, 1part H20. Swallow 15m of diluted mixture, 366m before meals and at bedtime, up to QID for sore throat. 250 mL 3   losartan (COZAAR) 25 MG tablet Take 12.5 mg by mouth daily.     omeprazole (PRILOSEC) 40 MG capsule Take 40 mg by mouth 2 (two) times daily.     simvastatin (ZOCOR) 20 MG tablet Take 20 mg by mouth daily.     triamcinolone cream (KENALOG) 0.1 % Apply 1 application topically 2 (two) times daily. 30 g 0   No current facility-administered medications for this encounter.    Physical Findings: The patient is in no acute distress. Patient is alert and oriented. Wt Readings from Last 3 Encounters:  11/26/20 232 lb 9 oz (105.5  kg)  06/24/20 237 lb 8 oz (107.7 kg)  06/15/20 236 lb 8 oz (107.3 kg)    height is '6\' 4"'$  (1.93 m) and weight is 232 lb 9 oz (105.5 kg). His temporal temperature is 96.9 F (36.1 C) (abnormal). His blood pressure is 117/65 and his pulse is 81. His respiration is 18 and oxygen saturation is 99%. .  General: Alert and oriented, in no acute distress HEENT: Head is normocephalic. Extraocular movements are intact. Oropharynx is notable for clear mucosa Neck: No palpable adenopathy Skin: Skin in treatment fields shows satisfactory healing   Psychiatric: Judgment and insight are intact. Affect  is appropriate. Heart regular in rate and rhythm Chest clear to auscultation bilaterally Abdomen soft nontender nondistended  PROCEDURE NOTE: After obtaining consent and spraying nasal cavity with topical oxymetazoline, the flexible endoscope was introduced and passed through the nasal cavity.  The nasopharynx, oropharynx, hypopharynx, and larynx  were then examined. No lesions appreciated in the mucosal axis.  The true cords were symmetrically mobile without nodules.  Lab Findings: Lab Results  Component Value Date   WBC 6.0 04/15/2020   HGB 15.6 04/15/2020   HCT 45.7 04/15/2020   MCV 88.4 04/15/2020   PLT 210 04/15/2020    Lab Results  Component Value Date   TSH 0.686 04/15/2020    Radiographic Findings: No results found.  Impression/Plan:    1) Head and Neck Cancer Status: No evidence of disease  2) Nutritional Status: No issues  3) Risk Factors: The patient has been educated about risk factors including tobacco abuse; they understand that avoidance of  tobacco is important to prevent recurrences as well as other cancers He is abstaining from tobacco products and smoking in general  4) Swallowing: Good function, continue swallowing exercises  5) I Thyroid function: Check at next appointment and annually Lab Results  Component Value Date   TSH 0.686 04/15/2020    7) follow-up with me in 6 months.  Follow-up with ENT in 3 months.  8) He knows to call if he has any concerns or questions in the future.  On date of service, in total, I spent 30 minutes on this encounter. Patient was seen in person. _____________________________________   Eppie Gibson, MD

## 2020-11-26 NOTE — Progress Notes (Signed)
Oncology Nurse Navigator Documentation   At Dr. Pearlie Oyster request I called Dr. Deeann Saint office to schedule Mr. Jesse Sharp for a follow up visit. He was already scheduled for 01/19/21 per Dr. Deeann Saint scheduler and I called Jesse Sharp and informed him of the appointment date and time. He voiced his understanding and knows to call me if he has any questions or concerns.    Harlow Asa RN, BSN, OCN Head & Neck Oncology Nurse [place] at [facility] Phone # [phone]  Fax # [phone]

## 2020-12-23 DIAGNOSIS — M5116 Intervertebral disc disorders with radiculopathy, lumbar region: Secondary | ICD-10-CM | POA: Diagnosis not present

## 2020-12-23 DIAGNOSIS — M9903 Segmental and somatic dysfunction of lumbar region: Secondary | ICD-10-CM | POA: Diagnosis not present

## 2021-01-19 DIAGNOSIS — Z8521 Personal history of malignant neoplasm of larynx: Secondary | ICD-10-CM | POA: Diagnosis not present

## 2021-01-20 DIAGNOSIS — M5116 Intervertebral disc disorders with radiculopathy, lumbar region: Secondary | ICD-10-CM | POA: Diagnosis not present

## 2021-01-20 DIAGNOSIS — M9903 Segmental and somatic dysfunction of lumbar region: Secondary | ICD-10-CM | POA: Diagnosis not present

## 2021-01-27 DIAGNOSIS — H16223 Keratoconjunctivitis sicca, not specified as Sjogren's, bilateral: Secondary | ICD-10-CM | POA: Diagnosis not present

## 2021-01-27 DIAGNOSIS — H35371 Puckering of macula, right eye: Secondary | ICD-10-CM | POA: Diagnosis not present

## 2021-01-31 DIAGNOSIS — S81802A Unspecified open wound, left lower leg, initial encounter: Secondary | ICD-10-CM | POA: Diagnosis not present

## 2021-01-31 DIAGNOSIS — B351 Tinea unguium: Secondary | ICD-10-CM | POA: Diagnosis not present

## 2021-02-01 DIAGNOSIS — E785 Hyperlipidemia, unspecified: Secondary | ICD-10-CM | POA: Diagnosis not present

## 2021-02-01 DIAGNOSIS — R7303 Prediabetes: Secondary | ICD-10-CM | POA: Diagnosis not present

## 2021-02-01 DIAGNOSIS — I10 Essential (primary) hypertension: Secondary | ICD-10-CM | POA: Diagnosis not present

## 2021-02-09 DIAGNOSIS — M5116 Intervertebral disc disorders with radiculopathy, lumbar region: Secondary | ICD-10-CM | POA: Diagnosis not present

## 2021-02-09 DIAGNOSIS — M9903 Segmental and somatic dysfunction of lumbar region: Secondary | ICD-10-CM | POA: Diagnosis not present

## 2021-02-10 DIAGNOSIS — I10 Essential (primary) hypertension: Secondary | ICD-10-CM | POA: Diagnosis not present

## 2021-02-10 DIAGNOSIS — Z0001 Encounter for general adult medical examination with abnormal findings: Secondary | ICD-10-CM | POA: Diagnosis not present

## 2021-02-10 DIAGNOSIS — R7303 Prediabetes: Secondary | ICD-10-CM | POA: Diagnosis not present

## 2021-02-10 DIAGNOSIS — D692 Other nonthrombocytopenic purpura: Secondary | ICD-10-CM | POA: Diagnosis not present

## 2021-02-10 DIAGNOSIS — I700 Atherosclerosis of aorta: Secondary | ICD-10-CM | POA: Diagnosis not present

## 2021-02-10 DIAGNOSIS — J209 Acute bronchitis, unspecified: Secondary | ICD-10-CM | POA: Diagnosis not present

## 2021-02-10 DIAGNOSIS — Z122 Encounter for screening for malignant neoplasm of respiratory organs: Secondary | ICD-10-CM | POA: Diagnosis not present

## 2021-02-10 DIAGNOSIS — E785 Hyperlipidemia, unspecified: Secondary | ICD-10-CM | POA: Diagnosis not present

## 2021-02-10 DIAGNOSIS — Z1211 Encounter for screening for malignant neoplasm of colon: Secondary | ICD-10-CM | POA: Diagnosis not present

## 2021-02-10 DIAGNOSIS — Z23 Encounter for immunization: Secondary | ICD-10-CM | POA: Diagnosis not present

## 2021-03-03 DIAGNOSIS — M9903 Segmental and somatic dysfunction of lumbar region: Secondary | ICD-10-CM | POA: Diagnosis not present

## 2021-03-03 DIAGNOSIS — M5116 Intervertebral disc disorders with radiculopathy, lumbar region: Secondary | ICD-10-CM | POA: Diagnosis not present

## 2021-03-30 DIAGNOSIS — M9903 Segmental and somatic dysfunction of lumbar region: Secondary | ICD-10-CM | POA: Diagnosis not present

## 2021-03-30 DIAGNOSIS — M5116 Intervertebral disc disorders with radiculopathy, lumbar region: Secondary | ICD-10-CM | POA: Diagnosis not present

## 2021-04-27 DIAGNOSIS — M9903 Segmental and somatic dysfunction of lumbar region: Secondary | ICD-10-CM | POA: Diagnosis not present

## 2021-04-27 DIAGNOSIS — M5116 Intervertebral disc disorders with radiculopathy, lumbar region: Secondary | ICD-10-CM | POA: Diagnosis not present

## 2021-05-03 DIAGNOSIS — J324 Chronic pansinusitis: Secondary | ICD-10-CM | POA: Diagnosis not present

## 2021-05-03 DIAGNOSIS — J343 Hypertrophy of nasal turbinates: Secondary | ICD-10-CM | POA: Diagnosis not present

## 2021-05-03 DIAGNOSIS — R0982 Postnasal drip: Secondary | ICD-10-CM | POA: Diagnosis not present

## 2021-05-18 ENCOUNTER — Other Ambulatory Visit: Payer: Self-pay | Admitting: Otolaryngology

## 2021-05-18 DIAGNOSIS — J329 Chronic sinusitis, unspecified: Secondary | ICD-10-CM

## 2021-05-20 ENCOUNTER — Encounter: Payer: Self-pay | Admitting: Radiation Oncology

## 2021-05-25 ENCOUNTER — Telehealth: Payer: Self-pay | Admitting: *Deleted

## 2021-05-25 NOTE — Telephone Encounter (Signed)
RETURNED PATIENT'S PHONE CALL, SPOKE WITH PATIENT. ?

## 2021-05-27 ENCOUNTER — Ambulatory Visit: Payer: Medicare HMO

## 2021-05-27 ENCOUNTER — Ambulatory Visit: Payer: Self-pay | Admitting: Radiation Oncology

## 2021-05-30 NOTE — Progress Notes (Signed)
Oncology Nurse Navigator Documentation   Jesse Sharp called me today reporting a chronic issue with a sinus infection. He has seen Dr. Benjamine Mola for this issue and is scheduled for at [facility] maxillofacial on 06/03/21. He has now developed a cough recently as well and asked to see Dr. Isidore Moos for evaluation. I advised that he should give Dr. Deeann Saint office a call to report the new sypmtoms of cough or notify his PCP. He voiced his understanding and plans to see Dr. Isidore Moos the end of February as scheduled.   Harlow Asa RN, BSN, OCN Head & Neck Oncology Nurse [place] at [facility] Phone # [phone]  Fax # [phone]

## 2021-06-02 ENCOUNTER — Encounter: Payer: Self-pay | Admitting: Radiation Oncology

## 2021-06-03 ENCOUNTER — Other Ambulatory Visit: Payer: Medicare HMO

## 2021-06-06 ENCOUNTER — Encounter: Payer: Self-pay | Admitting: Radiation Oncology

## 2021-06-06 ENCOUNTER — Ambulatory Visit
Admission: RE | Admit: 2021-06-06 | Discharge: 2021-06-06 | Disposition: A | Discharge status: Home / Self Care | Payer: Managed Care, Other (non HMO) | Source: Ambulatory Visit | Attending: Otolaryngology | Admitting: Otolaryngology

## 2021-06-06 DIAGNOSIS — J329 Chronic sinusitis, unspecified: Secondary | ICD-10-CM

## 2021-06-30 ENCOUNTER — Other Ambulatory Visit: Payer: Self-pay

## 2021-06-30 ENCOUNTER — Telehealth: Payer: Self-pay | Admitting: *Deleted

## 2021-06-30 DIAGNOSIS — Z1329 Encounter for screening for other suspected endocrine disorder: Secondary | ICD-10-CM

## 2021-06-30 NOTE — Telephone Encounter (Signed)
Called patient to  remind of lab and fu appt. with Dr. Isidore Moos on 07-01-21- lab - 2 pm and fu with Dr. Isidore Moos @ 2:20 pm, lvm for a return call

## 2021-06-30 NOTE — Progress Notes (Addendum)
Mr. Montanari presents today for follow-up after completing radiation to his larynx on 05/25/2020  Pain issues, if any: Mild sore throat Using a feeding tube?: N/A Weight changes, if any: 5lbs  Swallowing issues, if any: Mild Smoking or chewing tobacco? No Using fluoride trays daily? No N/A--see dentist regularly? Yes- December 2022. Goes regularly x6 months. Last ENT visit was on: 06/29/21, also saw Dr. Leta Baptist on 05/03/2021 Other notable issues, if any: Sinus infections  BP 108/65 (BP Location: Right Arm, Patient Position: Sitting, Cuff Size: Large)    Pulse 73    Temp 97.6 F (36.4 C) (Temporal)    Resp 20    Ht 6\' 4"  (1.93 m)    Wt 236 lb 3.2 oz (107.1 kg)    SpO2 99%    BMI 28.75 kg/m

## 2021-07-01 ENCOUNTER — Other Ambulatory Visit: Payer: Self-pay

## 2021-07-01 ENCOUNTER — Ambulatory Visit
Admission: RE | Admit: 2021-07-01 | Discharge: 2021-07-01 | Disposition: A | Discharge status: Home / Self Care | Payer: Medicare HMO | Source: Ambulatory Visit | Attending: Radiation Oncology | Admitting: Radiation Oncology

## 2021-07-01 ENCOUNTER — Ambulatory Visit
Admission: RE | Admit: 2021-07-01 | Discharge: 2021-07-01 | Disposition: A | Discharge status: Home / Self Care | Payer: Managed Care, Other (non HMO) | Source: Ambulatory Visit | Attending: Radiation Oncology | Admitting: Radiation Oncology

## 2021-07-01 ENCOUNTER — Encounter: Payer: Self-pay | Admitting: Radiation Oncology

## 2021-07-01 VITALS — BP 108/65 | HR 73 | Temp 97.6°F | Resp 20 | Ht 76.0 in | Wt 236.2 lb

## 2021-07-01 DIAGNOSIS — C320 Malignant neoplasm of glottis: Secondary | ICD-10-CM | POA: Insufficient documentation

## 2021-07-01 DIAGNOSIS — Z1329 Encounter for screening for other suspected endocrine disorder: Secondary | ICD-10-CM | POA: Diagnosis present

## 2021-07-01 LAB — TSH: TSH: 0.878 u[IU]/mL (ref 0.320–4.118)

## 2021-07-01 MED ORDER — OXYMETAZOLINE HCL 0.05 % NA SOLN
2.0000 | Freq: Once | NASAL | Status: AC
  Administered 2021-07-01: 2 via NASAL
  Filled 2021-07-01: qty 30

## 2021-07-01 NOTE — Progress Notes (Signed)
Radiation Oncology         [phone]) [phone] ________________________________  Name: Jesse Sharp MRN: [id number]  Date: 07/01/2021  DOB: 1945/10/07  Follow-Up Visit Note  CC: Audley Hose, MD  Audley Hose, MD  Diagnosis and Prior Radiotherapy:       ICD-10-CM   1. Malignant neoplasm of glottis (HCC)  C32.0 oxymetazoline (AFRIN) 0.05 % nasal spray 2 spray    Fiberoptic laryngoscopy      Cancer Staging: Cancer Staging Malignant neoplasm of glottis (HCC) Staging form: Larynx - Glottis, AJCC 8th Edition - Clinical stage from 04/06/2020: Stage I (cT1b, cN0, cM0) - Signed by Eppie Gibson, MD on 04/07/2020 Stage prefix: Initial diagnosis     Radiation Treatment Dates: 04/26/2020 through 05/25/2020 Site Technique Total Dose (Gy) Dose per Fx (Gy) Completed Fx Beam Energies  Larynx: HN_larynx 3D 55/55 2.75 20/20 6X   CHIEF COMPLAINT:  Here for follow-up and surveillance of glottic cancer  Narrative:   Mr. Jesse Sharp presents today for follow-up after completing radiation to his larynx on 05/25/2020  Pain issues, if any: Mild sore throat Using a feeding tube?: N/A Weight changes, if any: 5lbs  Swallowing issues, if any: Mild Smoking or chewing tobacco? No Using fluoride trays daily? No N/A--see dentist regularly? Yes- December 2022. Goes regularly x6 months. Last ENT visit was on: 06/29/21, also saw Dr. Leta Baptist on 05/03/2021 Other notable issues, if any: Sinus infections  BP 108/65 (BP Location: Right Arm, Patient Position: Sitting, Cuff Size: Large)    Pulse 73    Temp 97.6 F (36.4 C) (Temporal)    Resp 20    Ht 6\' 4"  (1.93 m)    Wt 236 lb 3.2 oz (107.1 kg)    SpO2 99%    BMI 28.75 kg/m      Vitals:   07/01/21 1430  BP: 108/65  Pulse: 73  Resp: 20  Temp: 97.6 F (36.4 C)  SpO2: 99%     ALLERGIES:  has No Known Allergies.  Meds: Current Outpatient Medications  Medication Sig Dispense Refill   ALPRAZolam (XANAX) 0.5 MG tablet Take 0.5 mg by mouth at  bedtime as needed for anxiety.     losartan (COZAAR) 25 MG tablet Take 12.5 mg by mouth daily.     simvastatin (ZOCOR) 20 MG tablet Take 20 mg by mouth daily.     triamcinolone cream (KENALOG) 0.1 % Apply 1 application topically 2 (two) times daily. 30 g 0   HYDROcodone-acetaminophen (HYCET) 7.5-325 mg/15 ml solution Take 10-15 mLs by mouth every 4 (four) hours as needed for moderate pain. Take with food. (Patient not taking: Reported on 07/01/2021) 473 mL 0   lidocaine (XYLOCAINE) 2 % solution Patient: Mix 1part 2% viscous lidocaine, 1part H20. Swallow 46mL of diluted mixture, 50min before meals and at bedtime, up to QID for sore throat. (Patient not taking: Reported on 07/01/2021) 250 mL 3   omeprazole (PRILOSEC) 40 MG capsule Take 40 mg by mouth 2 (two) times daily. (Patient not taking: Reported on 07/01/2021)     No current facility-administered medications for this encounter.    Physical Findings: The patient is in no acute distress. Patient is alert and oriented. Wt Readings from Last 3 Encounters:  07/01/21 236 lb 3.2 oz (107.1 kg)  11/26/20 232 lb 9 oz (105.5 kg)  06/24/20 237 lb 8 oz (107.7 kg)    height is 6\' 4"  (1.93 m) and weight is 236 lb 3.2 oz (107.1 kg). His temporal temperature  is 97.6 F (36.4 C). His blood pressure is 108/65 and his pulse is 73. His respiration is 20 and oxygen saturation is 99%. .  General: Alert and oriented, in no acute distress HEENT: Head is normocephalic. Extraocular movements are intact. Oropharynx is notable for clear mucosa Neck: No palpable adenopathy Skin: Skin in treatment fields shows satisfactory healing   Psychiatric: Judgment and insight are intact. Affect is appropriate. Heart regular in rate and rhythm Chest clear to auscultation bilaterally Ext: no edema  PROCEDURE NOTE: After obtaining consent and spraying nasal cavity with topical oxymetazoline, the flexible endoscope was introduced and passed through the nasal cavity.  The  nasopharynx, oropharynx, hypopharynx, and larynx  were then examined. No lesions appreciated in the mucosal axis.  The true cords were symmetrically mobile without nodules.  Lab Findings: Lab Results  Component Value Date   WBC 6.0 04/15/2020   HGB 15.6 04/15/2020   HCT 45.7 04/15/2020   MCV 88.4 04/15/2020   PLT 210 04/15/2020    Lab Results  Component Value Date   TSH 0.878 07/01/2021    Radiographic Findings: CT MAXILLOFACIAL WO CONTRAST  Result Date: 06/07/2021 CLINICAL DATA:  Chronic sinusitis, unspecified location EXAM: CT MAXILLOFACIAL WITHOUT CONTRAST TECHNIQUE: Multidetector CT imaging of the maxillofacial structures was performed. Multiplanar CT image reconstructions were also generated. RADIATION DOSE REDUCTION: This exam was performed according to the departmental dose-optimization program which includes automated exposure control, adjustment of the mA and/or kV according to patient size and/or use of iterative reconstruction technique. COMPARISON:  None. FINDINGS: Paranasal sinuses: Frontal: Mild mucosal thickening. Occlusion of right ostium and frontal recess. Partial occlusion of left ostium and frontal recess. Type 3 accessory cell on the right. Ethmoid: Primarily anterior ethmoid mucosal thickening. Maxillary: Left greater than right circumferential mucosal thickening. Small air-fluid level on the left. Occlusion of ostia bilaterally. Sphenoid: Normally aerated.  Occluded sphenoethmoidal recesses. Right ostiomeatal unit: Occluded infundibulum. Left ostiomeatal unit: Partially occluded infundibulum. Nasal passages: Patchy nasal cavity opacification. Intact nasal septum is mildly deviated to the right with a small spur contacting the inferior turbinate anteriorly. Anatomy: Cribriform plate and fovea ethmoidalis are intact and are relatively symmetric. Lateral lamella measures up to 4 mm. Sellar sphenoid pneumatization pattern. No dehiscence of carotid or optic canals. No onodi  cell. Other: Temporomandibular joints are unremarkable. Included mastoid air cells are clear. IMPRESSION: Paranasal sinus inflammatory changes with narrowing or occlusion of several drainage pathways. Electronically Signed   By: Macy Mis M.D.   On: 06/07/2021 13:12    Impression/Plan:    1) Head and Neck Cancer Status: No evidence of disease  2) Nutritional Status: No issues  3) Risk Factors: The patient has been educated about risk factors including tobacco abuse; they understand that avoidance of  tobacco is important to prevent recurrences as well as other cancers He is abstaining from tobacco products and smoking in general  4) Swallowing: Good function overall with mild issues at times with bread and other tough foods; he is considering ECG and dilation PRN with Dr. Benson Norway. He is not sure he wants to do this, however. I suggested he speak with Dr Benson Norway as he makes a final decision. His sx do not sound very significant at this time and I recommend he continue swallowing exercises and notify me if swallowing worsens.  5) I Thyroid function: WNL, check annually Lab Results  Component Value Date   TSH 0.878 07/01/2021    7) follow-up with me in 6 months.  Follow-up  with ENT as scheduled  8) He knows to call if he has any concerns or questions in the future.  On date of service, in total, I spent 35 minutes on this encounter. Patient was seen in person. _____________________________________   Eppie Gibson, MD

## 2021-07-05 ENCOUNTER — Other Ambulatory Visit: Payer: Self-pay

## 2021-07-05 DIAGNOSIS — Z1329 Encounter for screening for other suspected endocrine disorder: Secondary | ICD-10-CM

## 2021-12-29 ENCOUNTER — Telehealth: Payer: Self-pay | Admitting: *Deleted

## 2021-12-29 NOTE — Telephone Encounter (Signed)
CALLED PATIENT TO REMIND OF LAB APPT. FOR 12-30-21 @ 1:45 PM @ [facility], SPOKE WITH PATIENT AND HE IS AWARE OF THIS APPT.

## 2021-12-30 ENCOUNTER — Encounter: Payer: Self-pay | Admitting: Radiation Oncology

## 2021-12-30 ENCOUNTER — Ambulatory Visit
Admission: RE | Admit: 2021-12-30 | Discharge: 2021-12-30 | Disposition: A | Discharge status: Home / Self Care | Payer: Managed Care, Other (non HMO) | Source: Ambulatory Visit | Attending: Radiation Oncology | Admitting: Radiation Oncology

## 2021-12-30 VITALS — BP 111/71 | HR 77 | Temp 97.7°F | Resp 18 | Wt 240.0 lb

## 2021-12-30 DIAGNOSIS — C320 Malignant neoplasm of glottis: Secondary | ICD-10-CM | POA: Diagnosis present

## 2021-12-30 DIAGNOSIS — Z79899 Other long term (current) drug therapy: Secondary | ICD-10-CM | POA: Insufficient documentation

## 2021-12-30 DIAGNOSIS — Z1329 Encounter for screening for other suspected endocrine disorder: Secondary | ICD-10-CM | POA: Insufficient documentation

## 2021-12-30 LAB — TSH: TSH: 1.028 u[IU]/mL (ref 0.350–4.500)

## 2021-12-30 MED ORDER — OXYMETAZOLINE HCL 0.05 % NA SOLN
2.0000 | Freq: Once | NASAL | Status: AC
  Administered 2021-12-30: 2 via NASAL
  Filled 2021-12-30: qty 30

## 2021-12-30 NOTE — Progress Notes (Signed)
Radiation Oncology         [phone]) [phone] ________________________________  Name: Jesse Sharp MRN: [id number]  Date: 12/30/2021  DOB: 01-Jan-1946  Follow-Up Visit Note  CC: Audley Hose, MD  Audley Hose, MD  Diagnosis and Prior Radiotherapy:       ICD-10-CM   1. Malignant neoplasm of glottis (HCC)  C32.0 oxymetazoline (AFRIN) 0.05 % nasal spray 2 spray      Cancer Staging: Cancer Staging Malignant neoplasm of glottis (HCC) Staging form: Larynx - Glottis, AJCC 8th Edition - Clinical stage from 04/06/2020: Stage I (cT1b, cN0, cM0) - Signed by Eppie Gibson, MD on 04/07/2020 Stage prefix: Initial diagnosis     Radiation Treatment Dates: 04/26/2020 through 05/25/2020 Site Technique Total Dose (Gy) Dose per Fx (Gy) Completed Fx Beam Energies  Larynx: HN_larynx 3D 55/55 2.75 20/20 6X   CHIEF COMPLAINT:  Here for follow-up and surveillance of glottic cancer  Narrative:   Pain issues, if any: lower back pain from lifting at work Using a feeding tube?: no Weight changes, if any: no Swallowing issues, if any: no - needs to take his time and chew more Smoking or chewing tobacco? no Using fluoride trays daily? no Last ENT visit was on: Dr. Wilburn Cornelia - last saw him in February Other notable issues, if any: None noted.  BP 111/71 (BP Location: Left Arm)   Pulse 77   Temp 97.7 F (36.5 C) (Oral)   Resp 18   Wt 240 lb (108.9 kg)   SpO2 98%   BMI 29.21 kg/m       Vitals:   12/30/21 1413  BP: 111/71  Pulse: 77  Resp: 18  Temp: 97.7 F (36.5 C)  SpO2: 98%     ALLERGIES:  has No Known Allergies.  Meds: Current Outpatient Medications  Medication Sig Dispense Refill   ALPRAZolam (XANAX) 0.5 MG tablet Take 0.5 mg by mouth at bedtime as needed for anxiety.     losartan (COZAAR) 25 MG tablet Take 12.5 mg by mouth daily.     pravastatin (PRAVACHOL) 10 MG tablet Pravastatin     triamcinolone cream (KENALOG) 0.1 % Apply 1 application topically 2 (two) times  daily. (Patient not taking: Reported on 12/30/2021) 30 g 0   No current facility-administered medications for this encounter.    Physical Findings: The patient is in no acute distress. Patient is alert and oriented. Wt Readings from Last 3 Encounters:  12/30/21 240 lb (108.9 kg)  07/01/21 236 lb 3.2 oz (107.1 kg)  11/26/20 232 lb 9 oz (105.5 kg)    weight is 240 lb (108.9 kg). His oral temperature is 97.7 F (36.5 C). His blood pressure is 111/71 and his pulse is 77. His respiration is 18 and oxygen saturation is 98%. .  General: Alert and oriented, in no acute distress HEENT: Head is normocephalic. Extraocular movements are intact. Oropharynx is notable for clear mucosa Neck: No palpable adenopathy Skin: Skin in treatment fields shows satisfactory healing   Psychiatric: Judgment and insight are intact. Affect is appropriate. Heart regular in rate and rhythm Chest clear to auscultation bilaterally Ext: no edema  PROCEDURE NOTE: After obtaining consent and spraying nasal cavity with topical oxymetazoline, the flexible endoscope was coated with lidocaine gel and passed through the nasal cavity.  The nasopharynx, oropharynx, hypopharynx, and larynx  were then examined. No lesions appreciated in the mucosal axis.  The true cords were symmetrically mobile without nodules.  He tolerated this well  Lab Findings: Lab  Results  Component Value Date   WBC 6.0 04/15/2020   HGB 15.6 04/15/2020   HCT 45.7 04/15/2020   MCV 88.4 04/15/2020   PLT 210 04/15/2020    Lab Results  Component Value Date   TSH 0.878 07/01/2021    Radiographic Findings: No results found.  Impression/Plan:    1) Head and Neck Cancer Status: No evidence of disease  2) Nutritional Status: No issues  3) Risk Factors: The patient has been educated about risk factors including tobacco abuse; they understand that avoidance of  tobacco is important to prevent recurrences as well as other cancers He is abstaining from  tobacco products and smoking in general  4) Swallowing: Good function overall  5) I Thyroid function: WNL, check annually Lab Results  Component Value Date   TSH 0.878 07/01/2021    7) Follow-up with me in 8 months.  Follow-up with ENT in 4 mo.  8) He knows to call if he has any concerns or questions in the future.  On date of service, in total, I spent 30 minutes on this encounter. Patient was seen in person. _____________________________________   Eppie Gibson, MD

## 2021-12-30 NOTE — Progress Notes (Signed)
Oncology Nurse Navigator Documentation   I met Jesse Sharp today during his scheduled follow up with Dr. Isidore Moos after his treatment for head and neck cancer. He is feeling well and denied any concerns at this time. He was scheduled to see Dr. Isidore Moos in April for continued follow up.   I sent fax to [facility] ENT Scheduling with request Mr. Ashkar be contacted and scheduled for routine post-RT follow-up with and ENT in 4 months due to the recent retirement of his previous ENT, Dr. Wilburn Cornelia. Notification of successful fax transmission received.   He knows to call me if he has any questions or concerns in the future.   Harlow Asa RN, BSN, OCN Head & Neck Oncology Nurse [place] [facility] Phone # [phone]  Fax # [phone]

## 2021-12-30 NOTE — Progress Notes (Signed)
Pain issues, if any: lower back pain from lifting at work Using a feeding tube?: no Weight changes, if any: no Swallowing issues, if any: no - needs to take his time and chew more Smoking or chewing tobacco? no Using fluoride trays daily? no Last ENT visit was on: Dr. Wilburn Cornelia - last saw him in February Other notable issues, if any: None noted.  BP 111/71 (BP Location: Left Arm)   Pulse 77   Temp 97.7 F (36.5 C) (Oral)   Resp 18   Wt 240 lb (108.9 kg)   SpO2 98%   BMI 29.21 kg/m

## 2022-01-16 IMAGING — US US ABDOMINAL AORTA SCREENING AAA
1 series · 14 of 24 positions shown · non-contrast
Comparison: None.

CLINICAL DATA: Male between 65-75 years of age with a smoking
history.

EXAM:
US ABDOMINAL AORTA MEDICARE SCREENING
TECHNIQUE: Ultrasound examination of the abdominal aorta was performed as a
screening evaluation for abdominal aortic aneurysm.

[Series 1: us abdominal aorta screening aaa · 0.31mm/px · 14 of 24 slices shown]
[im 1/24]
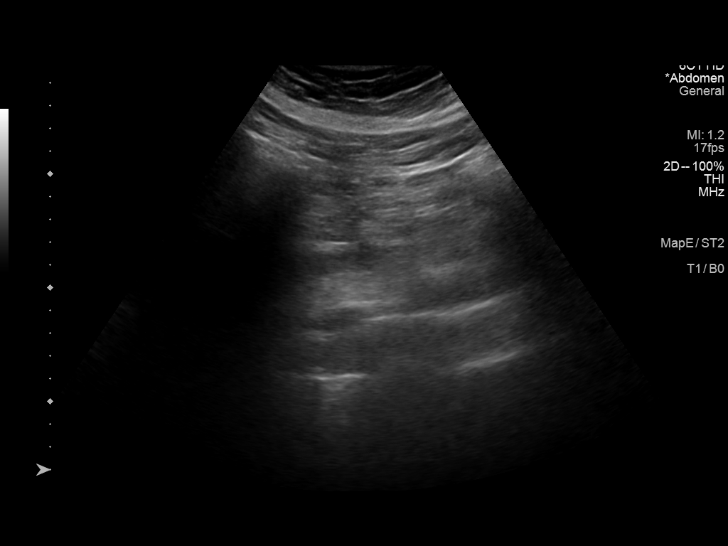
[im 3/24]
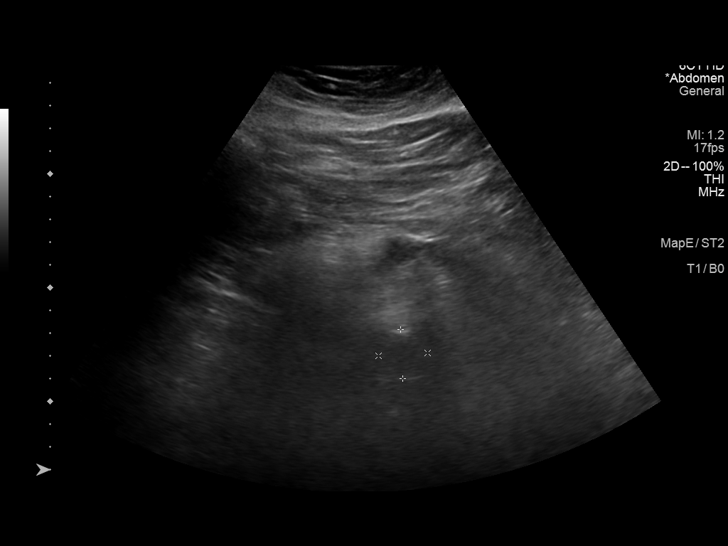
[im 5/24]
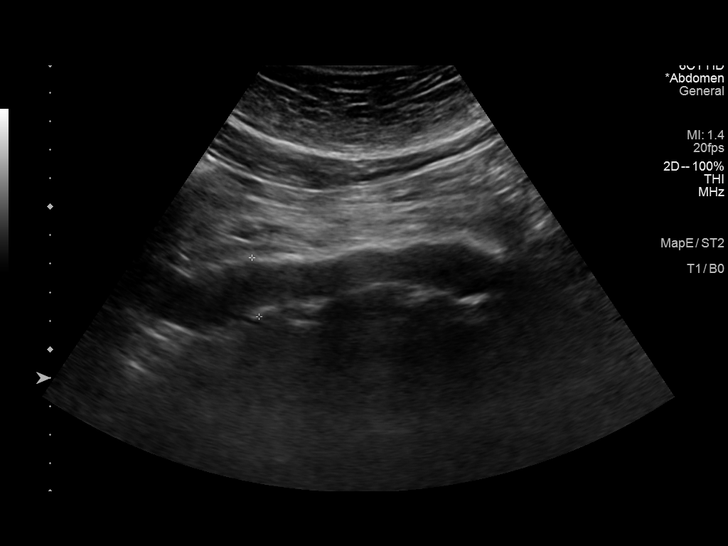
[im 7/24]
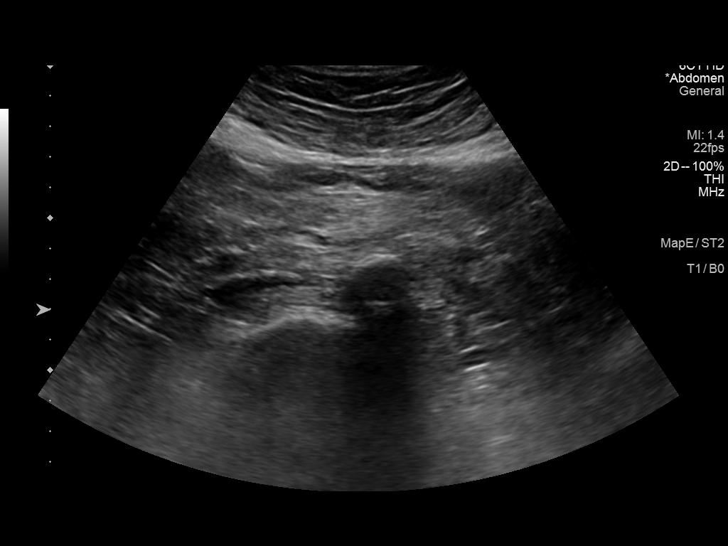
[im 8/24]
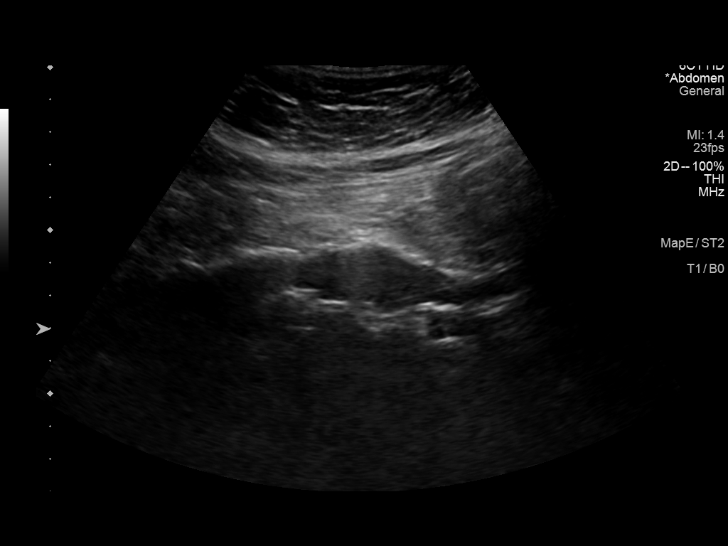
[im 10/24]
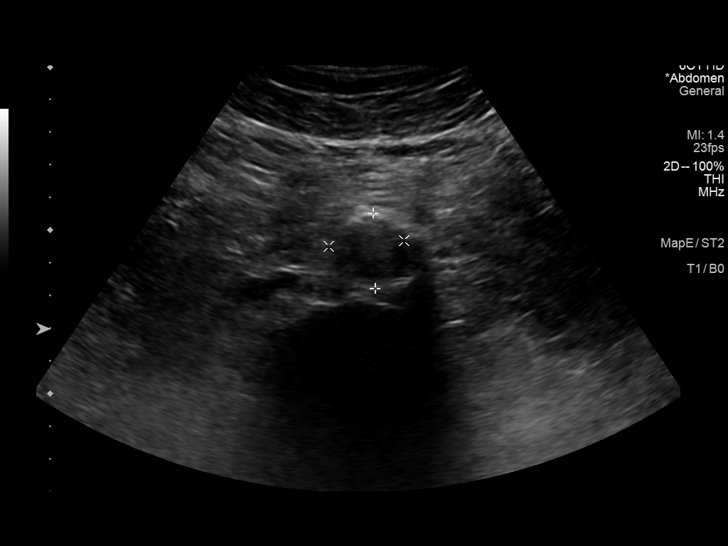
[im 12/24]
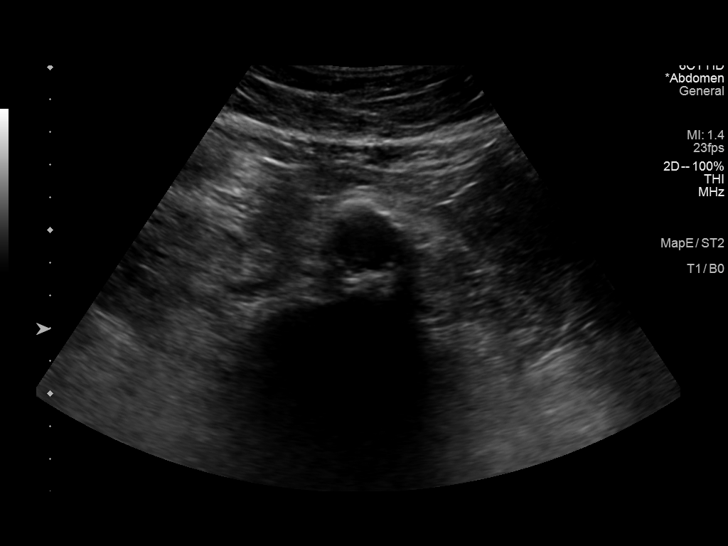
[im 13/24]
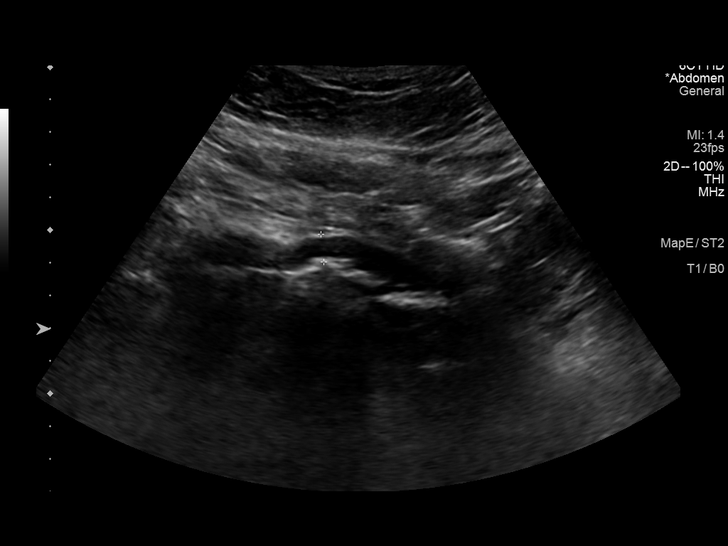
[im 15/24]
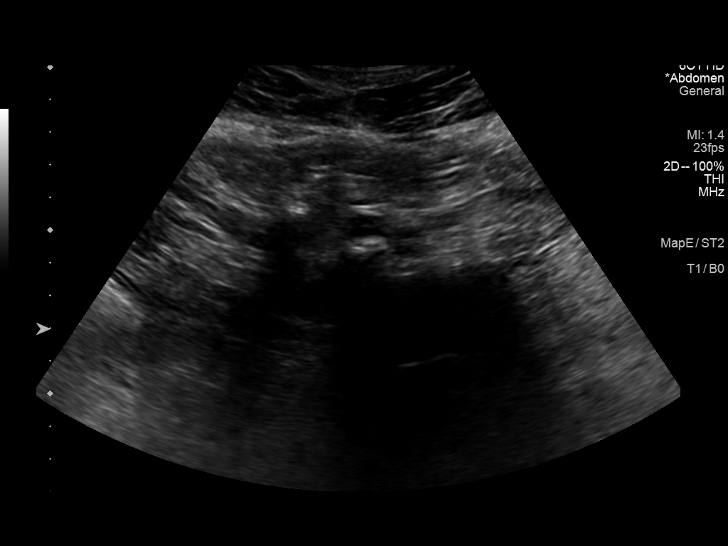
[im 17/24]
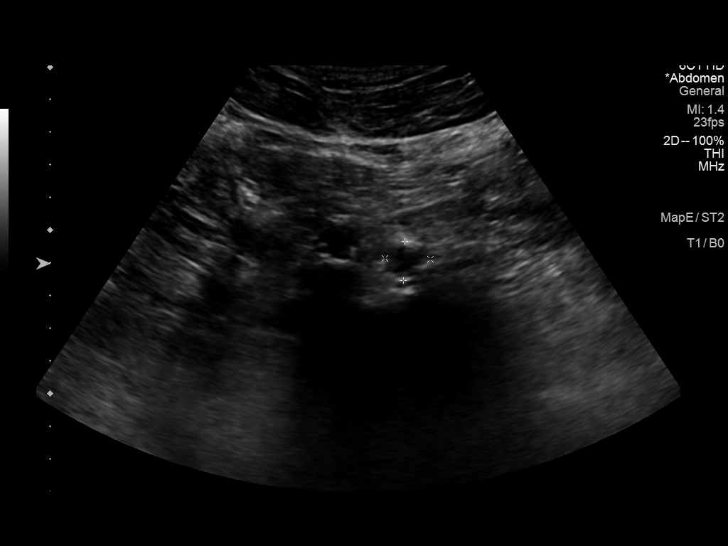
[im 19/24]
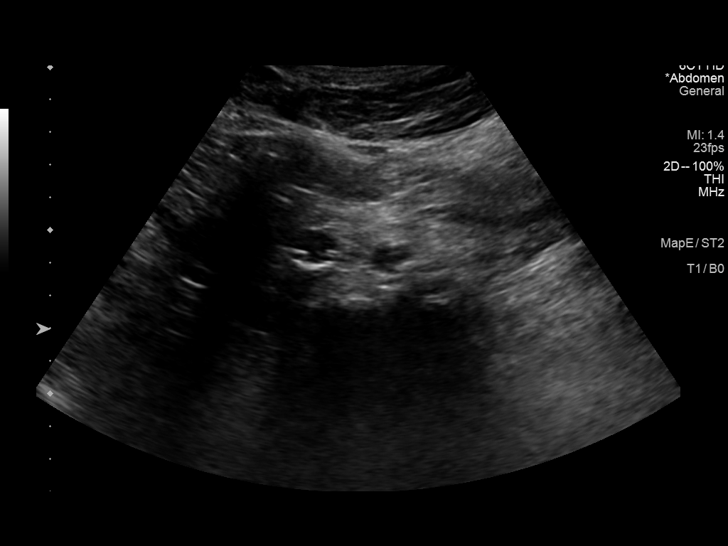
[im 20/24]
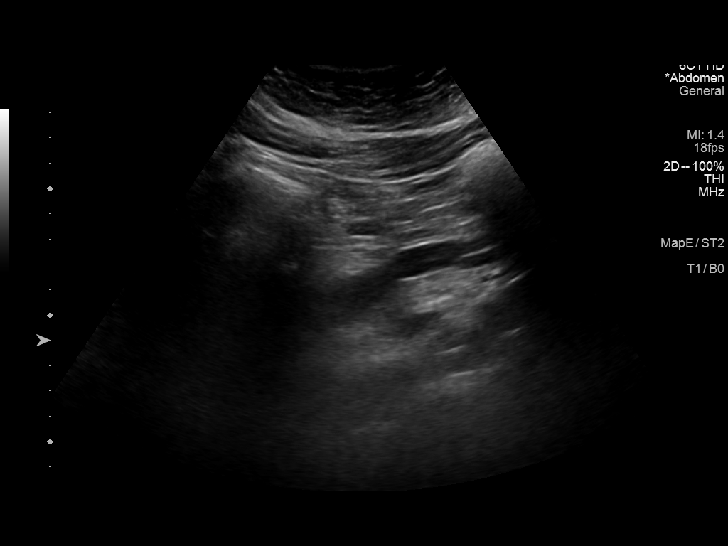
[im 22/24]
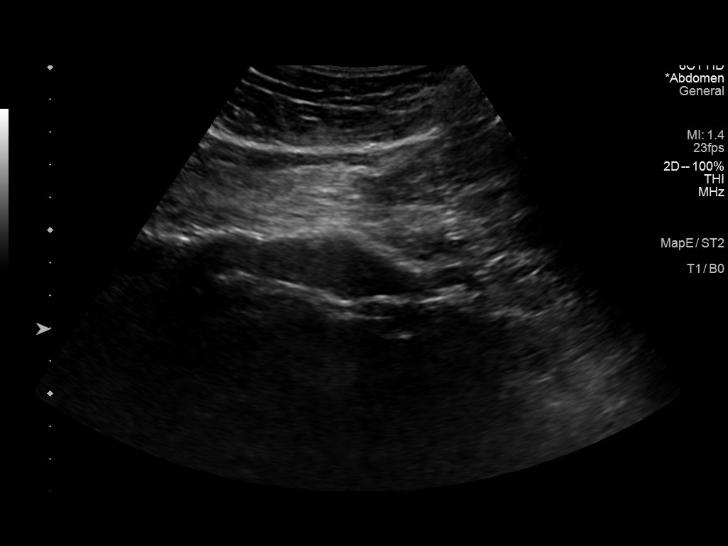
[im 24/24]
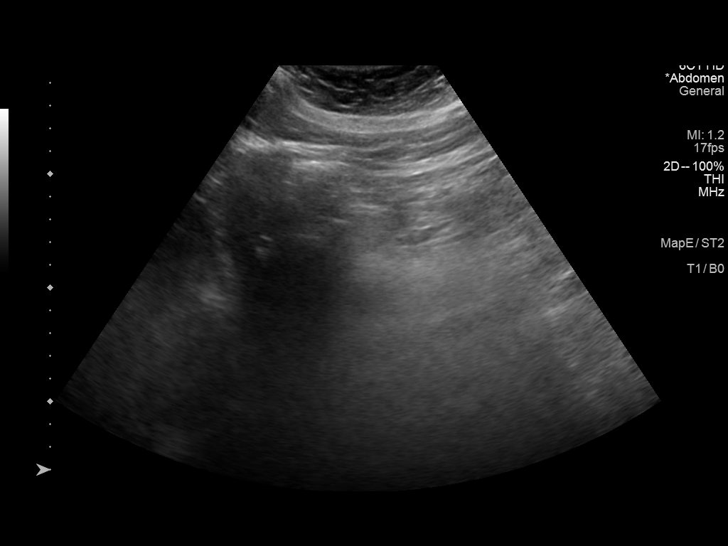

[14 of 24 positions shown; findings below may reference images not displayed]

FINDINGS: Abdominal aortic measurements as follows:

Proximal:  2.2 x 2.1 cm

Mid:  2.1 x 2.1 cm

Distal:  2.5 x 2.3 cm

Scattered eccentric mixed echogenic plaque is seen throughout the
abdominal aorta.

Right common iliac artery: 1.2 x 1.1 cm

Left common iliac artery: 1.4 x 1.2 cm
IMPRESSION: 1. Mild ectasia of the distal aspect of the abdominal aorta
measuring 2.5 cm in diameter. Recommend follow-up aortic ultrasound
in 5 years. This recommendation follows ACR consensus guidelines:
White Paper of the ACR Incidental Findings Committee II on Vascular
Findings. [HOSPITAL] 7374; [DATE].
2.  Aortic Atherosclerosis (H256K-VHU.U).

## 2022-03-28 IMAGING — CT CT NECK W/ CM
4 of 5 series · 13 of 33 positions shown, 15 images · IV contrast (omnipaque)
Comparison: None.

CLINICAL DATA: 74-year-old male with hoarseness status post
endoscopic excision of vocal cord mass positive for squamous cell
carcinoma. Staging.

EXAM:
CT NECK WITH CONTRAST
TECHNIQUE: Multidetector CT imaging of the neck was performed using the
standard protocol following the bolus administration of intravenous
contrast.
CONTRAST:  75mL OMNIPAQUE IOHEXOL 300 MG/ML  SOLN

[Series 5: axial bone · axial · 0.45mm/px · z∈[-304,-240]mm · 2 of 130 slices shown]
[im 33/130  bone]
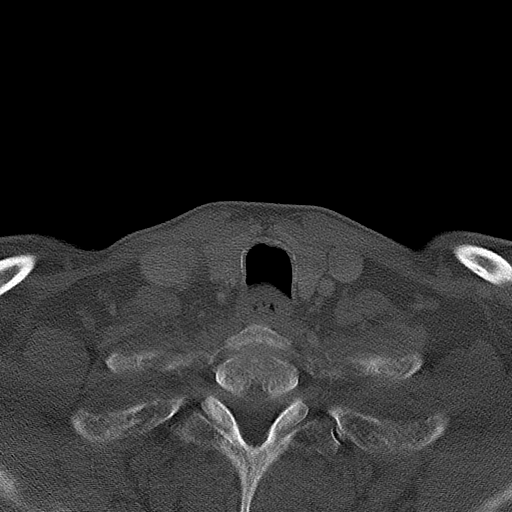
[im 65/130  bone]
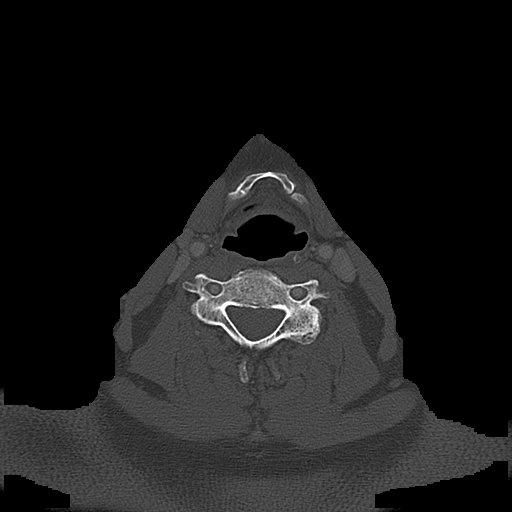

[Series 6: orthogonal (person_name) · axial · 0.39mm/px · z∈[-318,-179]mm · 3 of 141 slices shown, 4 images]
[im 36/141  soft-tissue]
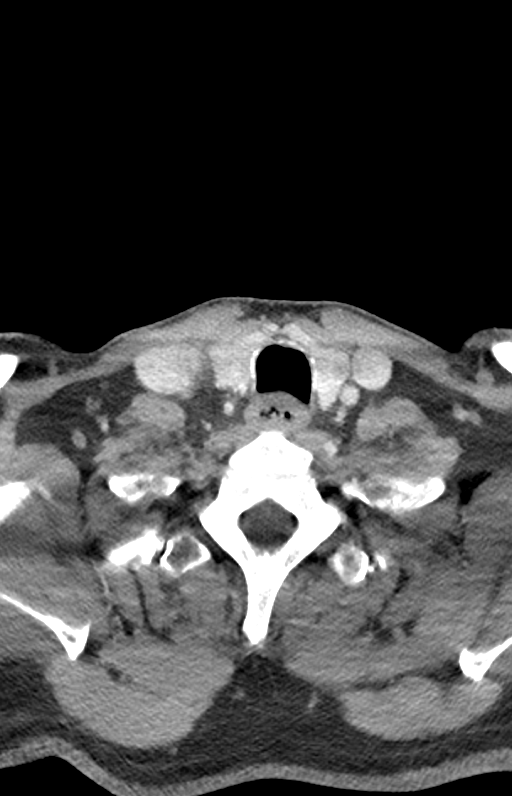
[im 36/141  bone]
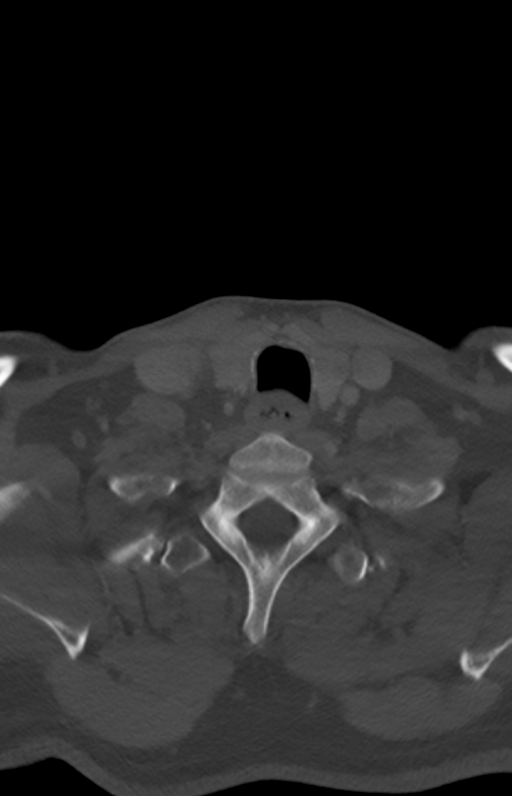
[im 71/141  bone]
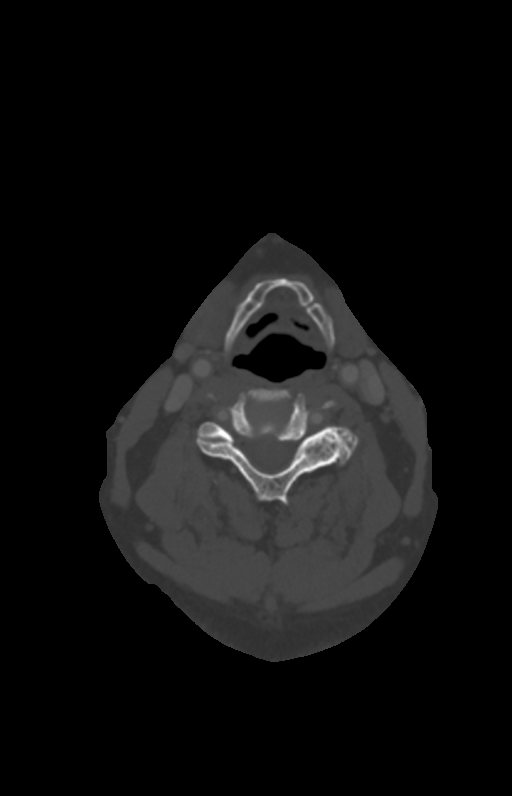
[im 106/141  bone]
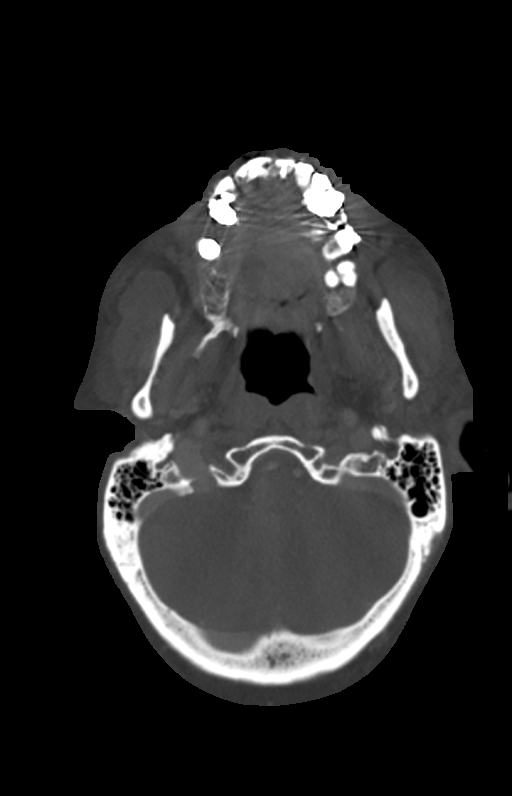

[Series 7: cor neck · coronal · 0.46mm/px · 3 of 123 slices shown]
[im 25/123  bone]
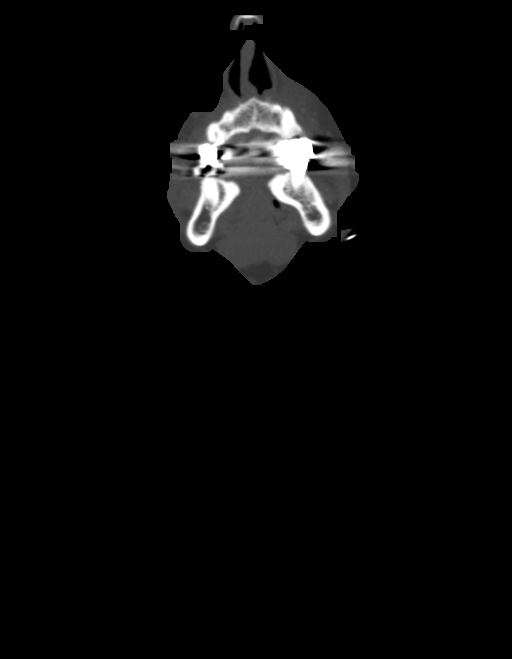
[im 49/123  bone]
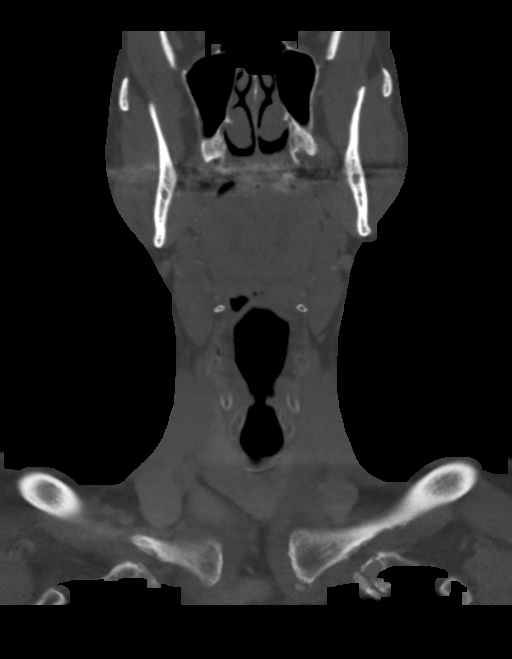
[im 74/123  bone]
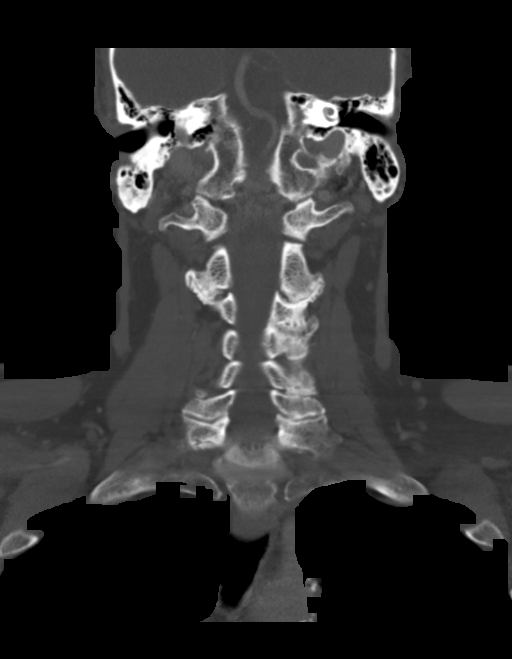

[Series 8: sag neck · sagittal · 0.51mm/px · 5 of 89 slices shown, 6 images]
[im 30/89  bone]
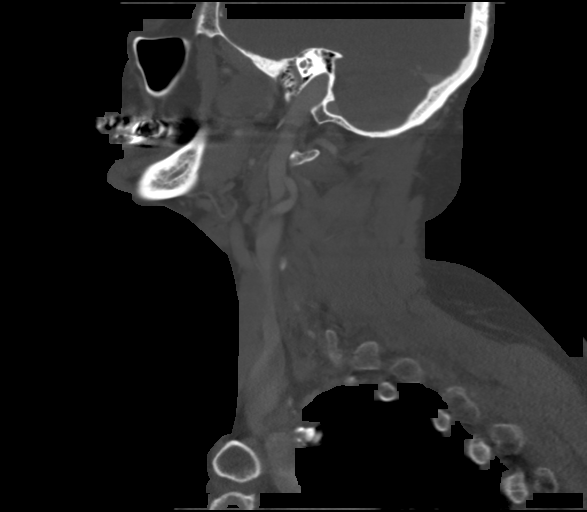
[im 37/89  bone]
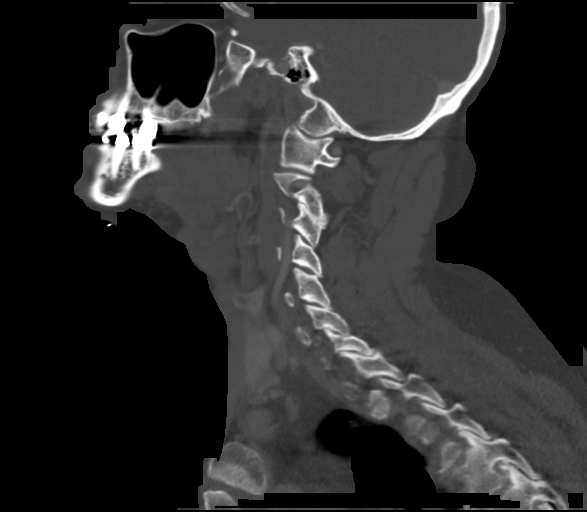
[im 45/89  soft-tissue]
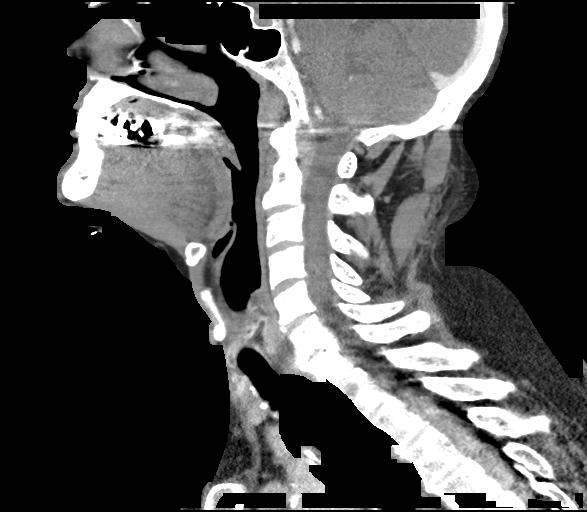
[im 45/89  bone]
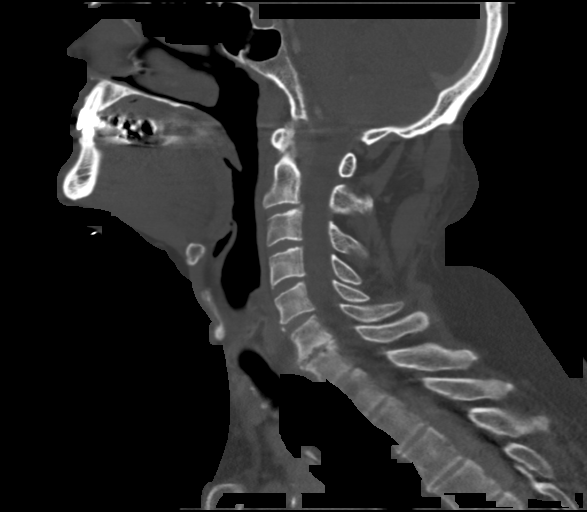
[im 52/89  bone]
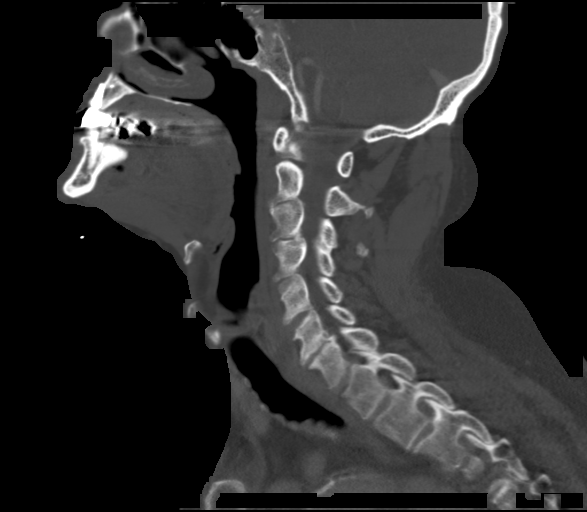
[im 59/89  bone]
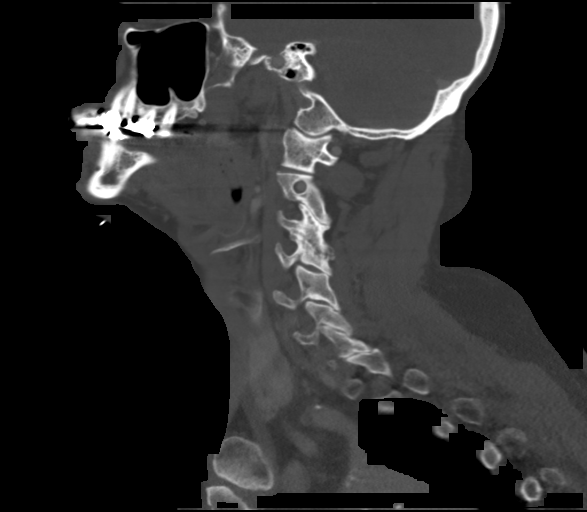

[13 of 33 positions shown; findings below may reference images not displayed]

FINDINGS: Pharynx and larynx: There is only minimal soft tissue asymmetry of
the larynx including the left true cord (series 3, image 85 and
coronal image 47). Questionable subcentimeter focus of
hyperenhancement at the left true cord (coronal image 45). Anterior
commissure and right true cord appear within normal limits.

Epiglottis and pharyngeal soft tissue contours are within normal
limits. Negative parapharyngeal and retropharyngeal spaces.

Salivary glands: Negative sublingual space. Submandibular glands and
parotid glands are within normal limits.

Thyroid: Negative for age.

Lymph nodes: No cervical lymphadenopathy. Symmetric, diminutive
bilateral cervical lymph nodes.

Vascular: Major vascular structures in the neck and at the skull
base are patent. Mix of soft and calcified plaque at the carotid
bifurcations. Calcified atherosclerosis at the skull base.

Limited intracranial: Negative.

Visualized orbits: Postoperative changes to both globes, otherwise
negative.

Mastoids and visualized paranasal sinuses: Paranasal sinuses are
clear. Tympanic cavities are clear. Mild posteroinferior right
mastoid opacification, fusion.

Skeleton: Mild for age cervical spine degeneration. No acute or
suspicious osseous lesion.

Upper chest: Calcified aortic atherosclerosis. No superior
mediastinal lymphadenopathy. Mild respiratory motion with otherwise
negative visible upper lungs.
IMPRESSION: 1. Only subtle asymmetry and enhancement at the left true cord. No
other laryngeal mass. No lymphadenopathy or metastatic disease
identified in the neck.

2. Aortic Atherosclerosis ([id number]).

## 2022-05-17 ENCOUNTER — Ambulatory Visit: Payer: Managed Care, Other (non HMO) | Admitting: Radiation Oncology

## 2022-08-02 ENCOUNTER — Encounter: Payer: Self-pay | Admitting: Radiation Oncology

## 2022-08-21 NOTE — Progress Notes (Signed)
  Jesse Sharp presents today for follow-up after completing radiation to his larynx on 05/25/2020   Pain issues, if any: Denies pain to treatment field. Reports generalized pain.  Using a feeding tube?: No Weight changes, if any:  Wt Readings from Last 3 Encounters:  09/01/22 236 lb 3.2 oz (107.1 kg)  12/30/21 240 lb (108.9 kg)  07/01/21 236 lb 3.2 oz (107.1 kg)    Swallowing issues, if any: No Smoking or chewing tobacco? No Using fluoride trays daily? No Last ENT visit was on: Feb. 2024.  Other notable issues, if any: Reports cough due to sinus drainage. Patient reports cramping sensation to neck with yawing.   Scope with Dr. Irene Limbo on 06-12-22 Procedure: After adequate topical anesthetic was applied, 4 mm flexible laryngoscope was passed through the nasal cavity without difficulty. Flexible laryngoscopy shows patent anterior nasal cavity with minimal crusting. Some mucoid drainage noted in the left nasal cavity, with no frank purulence or nasal polyps. Normal base of tongue and supraglottis Normal vocal cord mobility without vocal cord nodule, mass, polyp or tumor. Hypopharynx normal without mass, pooling of secretions or aspiration.   BP 129/70   Pulse 77   Temp (!) 96.1 F (35.6 C)   Resp 18   Wt 236 lb 3.2 oz (107.1 kg)   SpO2 99%   BMI 28.75 kg/m

## 2022-09-01 ENCOUNTER — Ambulatory Visit
Admission: RE | Admit: 2022-09-01 | Discharge: 2022-09-01 | Disposition: A | Discharge status: Home / Self Care | Payer: 59 | Source: Ambulatory Visit | Attending: Radiation Oncology | Admitting: Radiation Oncology

## 2022-09-01 ENCOUNTER — Telehealth: Payer: Self-pay | Admitting: *Deleted

## 2022-09-01 ENCOUNTER — Encounter: Payer: Self-pay | Admitting: Radiation Oncology

## 2022-09-01 VITALS — BP 129/70 | HR 77 | Temp 96.1°F | Resp 18 | Wt 236.2 lb

## 2022-09-01 DIAGNOSIS — Z79899 Other long term (current) drug therapy: Secondary | ICD-10-CM | POA: Insufficient documentation

## 2022-09-01 DIAGNOSIS — Z8521 Personal history of malignant neoplasm of larynx: Secondary | ICD-10-CM | POA: Insufficient documentation

## 2022-09-01 DIAGNOSIS — C320 Malignant neoplasm of glottis: Secondary | ICD-10-CM

## 2022-09-01 LAB — TSH: TSH: 1.002 u[IU]/mL (ref 0.350–4.500)

## 2022-09-01 MED ORDER — OXYMETAZOLINE HCL 0.05 % NA SOLN
1.0000 | Freq: Once | NASAL | Status: AC
  Administered 2022-09-01: 1 via NASAL
  Filled 2022-09-01: qty 30

## 2022-09-01 NOTE — Telephone Encounter (Signed)
CALLED PATIENT TO INFORM OF FU APPT. WITH DR. Basilio Cairo ON 05-08-23 @ 2 PM, SPOKE WITH PATIENT AND HE IS AWARE OF THIS APPT.

## 2022-09-01 NOTE — Progress Notes (Signed)
Radiation Oncology         [phone]) [phone] ________________________________  Name: Jesse Sharp MRN: [id number]  Date: 09/01/2022  DOB: 01/04/1946  Follow-Up Visit Note  CC: Bakare, Cyndee Brightly, MD  Harvest [place], MD  Diagnosis and Prior Radiotherapy:       ICD-10-CM   1. Malignant neoplasm of glottis (HCC) [C32.0]  C32.0 TSH    oxymetazoline (AFRIN) 0.05 % nasal spray 1 spray      Cancer Staging: Cancer Staging Malignant neoplasm of glottis (HCC) Staging form: Larynx - Glottis, AJCC 8th Edition - Clinical stage from 04/06/2020: Stage I (cT1b, cN0, cM0) - Signed by Lonie Peak, MD on 04/07/2020 Stage prefix: Initial diagnosis     Radiation Treatment Dates: 04/26/2020 through 05/25/2020 Site Technique Total Dose (Gy) Dose per Fx (Gy) Completed Fx Beam Energies  Larynx: HN_larynx 3D 55/55 2.75 20/20 6X   CHIEF COMPLAINT:  Here for follow-up and surveillance of glottic cancer  Narrative:     doing well - still working 30 hours a week, including night shifts at [employer]  Pain issues, if any: Denies pain to treatment field. Reports generalized pain.  Using a feeding tube?: No Weight changes, if any:  Wt Readings from Last 3 Encounters:  09/01/22 236 lb 3.2 oz (107.1 kg)  12/30/21 240 lb (108.9 kg)  07/01/21 236 lb 3.2 oz (107.1 kg)    Swallowing issues, if any: No Smoking or chewing tobacco? No Using fluoride trays daily? No Last ENT visit was on: Feb. 2024.  Other notable issues, if any: Reports cough due to sinus drainage. Patient reports cramping sensation to neck with yawing.   Scope with Dr. Irene Limbo on 06-12-22 Procedure: After adequate topical anesthetic was applied, 4 mm flexible laryngoscope was passed through the nasal cavity without difficulty. Flexible laryngoscopy shows patent anterior nasal cavity with minimal crusting. Some mucoid drainage noted in the left nasal cavity, with no frank purulence or nasal polyps. Normal base of tongue and  supraglottis Normal vocal cord mobility without vocal cord nodule, mass, polyp or tumor. Hypopharynx normal without mass, pooling of secretions or aspiration.   BP 129/70   Pulse 77   Temp (!) 96.1 F (35.6 C)   Resp 18   Wt 236 lb 3.2 oz (107.1 kg)   SpO2 99%   BMI 28.75 kg/m        Vitals:   09/01/22 1359  BP: 129/70  Pulse: 77  Resp: 18  Temp: (!) 96.1 F (35.6 C)  SpO2: 99%     ALLERGIES:  has No Known Allergies.  Meds: Current Outpatient Medications  Medication Sig Dispense Refill   ALPRAZolam (XANAX) 0.5 MG tablet Take 0.5 mg by mouth at bedtime as needed for anxiety.     losartan (COZAAR) 25 MG tablet Take 12.5 mg by mouth daily.     pravastatin (PRAVACHOL) 10 MG tablet Pravastatin     triamcinolone cream (KENALOG) 0.1 % Apply 1 application topically 2 (two) times daily. (Patient not taking: Reported on 12/30/2021) 30 g 0   No current facility-administered medications for this encounter.    Physical Findings: The patient is in no acute distress. Patient is alert and oriented. Wt Readings from Last 3 Encounters:  09/01/22 236 lb 3.2 oz (107.1 kg)  12/30/21 240 lb (108.9 kg)  07/01/21 236 lb 3.2 oz (107.1 kg)    weight is 236 lb 3.2 oz (107.1 kg). His temperature is 96.1 F (35.6 C) (abnormal). His blood pressure is 129/70 and  his pulse is 77. His respiration is 18 and oxygen saturation is 99%. .  General: Alert and oriented, in no acute distress HEENT: Head is normocephalic. Extraocular movements are intact. Oropharynx is notable for clear mucosa Neck: No palpable adenopathy Skin: Skin in treatment fields shows satisfactory healing   Psychiatric: Judgment and insight are intact. Affect is appropriate. Heart regular in rate and rhythm Chest clear to auscultation bilaterally Ext: no edema  PROCEDURE NOTE: After obtaining consent and spraying nasal cavity with topical oxymetazoline, the flexible endoscope was coated with lidocaine gel and passed through  the nasal cavity.  The nasopharynx, oropharynx, hypopharynx, and larynx  were then examined. No lesions appreciated in the mucosal axis.  The true cords were symmetrically mobile without nodules.  He tolerated this well  Lab Findings: Lab Results  Component Value Date   WBC 6.0 04/15/2020   HGB 15.6 04/15/2020   HCT 45.7 04/15/2020   MCV 88.4 04/15/2020   PLT 210 04/15/2020    Lab Results  Component Value Date   TSH 1.028 12/30/2021    Radiographic Findings: No results found.  Impression/Plan:    1) Head and Neck Cancer Status: No evidence of disease  2) Nutritional Status: No issues  3) Risk Factors: The patient has been educated about risk factors including tobacco abuse; they understand that avoidance of  tobacco is important to prevent recurrences as well as other cancers. He is abstaining from tobacco products and smoking in general  4) Swallowing: Good function overall  5) I Thyroid function: WNL, results pending today (patient sent to lab after exam today) Lab Results  Component Value Date   TSH 1.028 12/30/2021    7) Follow-up with me in 8 months.  Follow-up with ENT in interim  8) He knows to call if he has any concerns or questions in the future.  On date of service, in total, I spent 30 minutes on this encounter. Patient was seen in person. _____________________________________   Lonie Peak, MD

## 2023-04-20 ENCOUNTER — Telehealth: Payer: Self-pay | Admitting: *Deleted

## 2023-04-20 NOTE — Telephone Encounter (Signed)
Returned patient's phone call, spoke with patient 

## 2023-04-20 NOTE — Telephone Encounter (Signed)
CALLED PATIENT TO ASK ABOUT RESCHEDULING FU ON 05-08-23 DUE TO DR. SQUIRE BEING ON VACATION, RESCHEDULED FOR 05-18-23 @ 10 AM, LVM FOR A RETURN CALL

## 2023-05-08 ENCOUNTER — Ambulatory Visit: Payer: Self-pay | Admitting: Radiation Oncology

## 2023-05-11 ENCOUNTER — Encounter: Payer: Self-pay | Admitting: Radiation Oncology

## 2023-05-18 ENCOUNTER — Ambulatory Visit
Admission: RE | Admit: 2023-05-18 | Discharge: 2023-05-18 | Disposition: A | Discharge status: Home / Self Care | Payer: 59 | Source: Ambulatory Visit | Attending: Radiation Oncology | Admitting: Radiation Oncology

## 2023-05-18 VITALS — BP 146/82 | HR 82 | Temp 97.5°F | Resp 16 | Ht 76.0 in | Wt 239.0 lb

## 2023-05-18 DIAGNOSIS — Z87891 Personal history of nicotine dependence: Secondary | ICD-10-CM | POA: Diagnosis not present

## 2023-05-18 DIAGNOSIS — Z79899 Other long term (current) drug therapy: Secondary | ICD-10-CM | POA: Diagnosis not present

## 2023-05-18 DIAGNOSIS — C320 Malignant neoplasm of glottis: Secondary | ICD-10-CM | POA: Diagnosis present

## 2023-05-18 DIAGNOSIS — Z1329 Encounter for screening for other suspected endocrine disorder: Secondary | ICD-10-CM | POA: Insufficient documentation

## 2023-05-18 DIAGNOSIS — Z923 Personal history of irradiation: Secondary | ICD-10-CM | POA: Diagnosis not present

## 2023-05-18 MED ORDER — OXYMETAZOLINE HCL 0.05 % NA SOLN
2.0000 | Freq: Once | NASAL | Status: AC
  Administered 2023-05-18: 2 via NASAL
  Filled 2023-05-18: qty 30

## 2023-05-18 NOTE — Progress Notes (Signed)
 Jesse Sharp presents today for follow-up after completing radiation to his larynx on 05/25/2020  Pain issues, if any: Denies any mouth or throat pain, but does continue to deal with discomfort on the left side of his neck when he yawns Using a feeding tube?: N/A Weight changes, if any:  Wt Readings from Last 3 Encounters:  05/18/23 239 lb (108.4 kg)  09/01/22 236 lb 3.2 oz (107.1 kg)  12/30/21 240 lb (108.9 kg)   Swallowing issues, if any: Denies as long as he takes his time and chews his food very thoroughly  Smoking or chewing tobacco? Denies Using fluoride toothpaste daily? Yes--denies any dental concerns or mouth sores Last ENT visit was on: Not since seeing Dr. Gerard Shope on 06/12/2022 Other notable issues, if any: Continues to deal with sinus drainage (reports he's being treated for a recent sinus infection)

## 2023-05-18 NOTE — Progress Notes (Signed)
 Oncology Nurse Navigator Documentation   Per patient's 05/18/23 post-treatment follow-up with Dr. Izell, sent fax to [facility] Scheduling with request Mr. Kolander be contacted and scheduled for routine post-RT follow-up with Dr. Llewellyn in 6 months. Notification of successful fax transmission received.   Delon Jefferson RN, BSN, OCN Head & Neck Oncology Nurse Navigator [facility] Phone # [phone]  Fax # [phone]

## 2023-05-21 ENCOUNTER — Encounter: Payer: Self-pay | Admitting: Radiation Oncology

## 2023-05-21 ENCOUNTER — Other Ambulatory Visit: Payer: Self-pay

## 2023-05-21 DIAGNOSIS — C320 Malignant neoplasm of glottis: Secondary | ICD-10-CM

## 2023-05-21 NOTE — Progress Notes (Signed)
 Radiation Oncology         [phone]) [phone] ________________________________  Name: Jesse Sharp MRN: [id number]  Date: 05/18/2023  DOB: 1946-01-29  Follow-Up Visit Note  CC: Bakare, Ezekiel NOVAK, MD  Jesse Ezekiel NOVAK, MD  Diagnosis and Prior Radiotherapy:       ICD-10-CM   1. Malignant neoplasm of glottis (HCC) [C32.0]  C32.0 oxymetazoline  (AFRIN) 0.05 % nasal spray 2 spray    Fiberoptic laryngoscopy    2. Screening for hypothyroidism  Z13.29 TSH      Cancer Staging: Cancer Staging Malignant neoplasm of glottis (HCC) Staging form: Larynx - Glottis, AJCC 8th Edition - Clinical stage from 04/06/2020: Stage I (cT1b, cN0, cM0) - Signed by Jesse Domino, MD on 04/07/2020 Stage prefix: Initial diagnosis     Radiation Treatment Dates: 04/26/2020 through 05/25/2020 Site Technique Total Dose (Gy) Dose per Fx (Gy) Completed Fx Beam Energies  Larynx: HN_larynx 3D 55/55 2.75 20/20 6X   CHIEF COMPLAINT:  Here for follow-up and surveillance of glottic cancer  Narrative:    Jesse Sharp presents today for follow-up after completing radiation to his larynx on 05/25/2020  Pain issues, if any: Denies any mouth or throat pain, but does continue to deal with discomfort on the left side of his neck when he yawns Using a feeding tube?: N/A Weight changes, if any:  Wt Readings from Last 3 Encounters:  05/18/23 239 lb (108.4 kg)  09/01/22 236 lb 3.2 oz (107.1 kg)  12/30/21 240 lb (108.9 kg)   Swallowing issues, if any: Denies as long as he takes his time and chews his food very thoroughly  Smoking or chewing tobacco? Denies Using fluoride toothpaste daily? Yes--denies any dental concerns or mouth sores Last ENT visit was on: Not since seeing Jesse Sharp on 06/12/2022 Other notable issues, if any: Continues to deal with sinus drainage (reports he's being treated for a recent sinus infection)      Vitals:   05/18/23 1138  BP: (!) 146/82  Pulse: 82  Resp: 16  Temp: (!) 97.5 F  (36.4 C)  SpO2: 99%     ALLERGIES:  has no known allergies.  Meds: Current Outpatient Medications  Medication Sig Dispense Refill   ALPRAZolam (XANAX) 0.5 MG tablet Take 0.5 mg by mouth at bedtime as needed for anxiety.     losartan (COZAAR) 25 MG tablet Take 12.5 mg by mouth daily.     pravastatin (PRAVACHOL) 10 MG tablet Pravastatin     triamcinolone  cream (KENALOG ) 0.1 % Apply 1 application topically 2 (two) times daily. (Patient not taking: Reported on 12/30/2021) 30 g 0   No current facility-administered medications for this encounter.    Physical Findings: The patient is in no acute distress. Patient is alert and oriented. Wt Readings from Last 3 Encounters:  05/18/23 239 lb (108.4 kg)  09/01/22 236 lb 3.2 oz (107.1 kg)  12/30/21 240 lb (108.9 kg)    height is 6' 4 (1.93 m) and weight is 239 lb (108.4 kg). His oral temperature is 97.5 F (36.4 C) (abnormal). His blood pressure is 146/82 (abnormal) and his pulse is 82. His respiration is 16 and oxygen saturation is 99%. .  General: Alert and oriented, in no acute distress HEENT: Head is normocephalic. Extraocular movements are intact. Oropharynx is notable for clear mucosa Neck: No palpable adenopathy Skin: Skin in treatment fields shows satisfactory healing   Psychiatric: Judgment and insight are intact. Affect is appropriate. Heart regular in rate and rhythm Chest clear  to auscultation bilaterally Ext: no edema  PROCEDURE NOTE: After obtaining consent and spraying nasal cavity with topical oxymetazoline , the flexible endoscope was coated with lidocaine  gel and passed through the nasal cavity.  The nasopharynx, oropharynx, hypopharynx, and larynx  were then examined. No lesions appreciated in the mucosal axis.  The true cords were symmetrically mobile without nodules.  He tolerated this well  Lab Findings: Lab Results  Component Value Date   WBC 6.0 04/15/2020   HGB 15.6 04/15/2020   HCT 45.7 04/15/2020   MCV 88.4  04/15/2020   PLT 210 04/15/2020    Lab Results  Component Value Date   TSH 1.002 09/01/2022    Radiographic Findings: No results found.  Impression/Plan:    1) Head and Neck Cancer Status: No evidence of disease  2) Nutritional Status: No issues  3) Risk Factors: The patient has been educated about risk factors including tobacco abuse; they understand that avoidance of  tobacco is important to prevent recurrences as well as other cancers. He is abstaining from tobacco products and smoking in general  4) Swallowing: Good function overall  5) I Thyroid  function: WNL, results pending today (patient sent to lab after exam today) Lab Results  Component Value Date   TSH 1.002 09/01/2022    7) Follow-up with me in 12 months.  Follow-up with ENT in interim  8) He knows to call if he has any concerns or questions in the future.  On date of service, in total, I spent 25 minutes on this encounter. Patient was seen in person. Note signed after encounter date; minutes pertain to date of service, only.  _____________________________________   Jesse Golden, MD

## 2023-05-22 LAB — TSH: TSH: 1.327 u[IU]/mL (ref 0.350–4.500)

## 2023-05-23 ENCOUNTER — Other Ambulatory Visit: Payer: Self-pay

## 2023-05-23 DIAGNOSIS — C320 Malignant neoplasm of glottis: Secondary | ICD-10-CM

## 2023-12-12 ENCOUNTER — Ambulatory Visit (INDEPENDENT_AMBULATORY_CARE_PROVIDER_SITE_OTHER): Admitting: Physician Assistant

## 2023-12-12 ENCOUNTER — Encounter: Payer: Self-pay | Admitting: Physician Assistant

## 2023-12-12 DIAGNOSIS — Z1283 Encounter for screening for malignant neoplasm of skin: Secondary | ICD-10-CM

## 2023-12-12 DIAGNOSIS — L821 Other seborrheic keratosis: Secondary | ICD-10-CM | POA: Diagnosis not present

## 2023-12-12 DIAGNOSIS — L814 Other melanin hyperpigmentation: Secondary | ICD-10-CM | POA: Diagnosis not present

## 2023-12-12 DIAGNOSIS — D692 Other nonthrombocytopenic purpura: Secondary | ICD-10-CM | POA: Diagnosis not present

## 2023-12-12 DIAGNOSIS — W908XXA Exposure to other nonionizing radiation, initial encounter: Secondary | ICD-10-CM

## 2023-12-12 DIAGNOSIS — L578 Other skin changes due to chronic exposure to nonionizing radiation: Secondary | ICD-10-CM

## 2023-12-12 DIAGNOSIS — D229 Melanocytic nevi, unspecified: Secondary | ICD-10-CM

## 2023-12-12 DIAGNOSIS — D1801 Hemangioma of skin and subcutaneous tissue: Secondary | ICD-10-CM

## 2023-12-12 NOTE — Progress Notes (Signed)
   New Patient Visit   Subjective  Jesse Sharp is a 78 y.o. male who presents for the following: Discoloration on hands  Jesse Sharp also has discolored bruising on both hands. He started bruising really easily. PCP sent a referral to us .   Also c/o brown spots on arms and face.  The following portions of the chart were reviewed this encounter and updated as appropriate: medications, allergies, medical history  Review of Systems:  No other skin or systemic complaints except as noted in HPI or Assessment and Plan.  Objective  Well appearing patient in no apparent distress; mood and affect are within normal limits.  A focused examination was performed of the following areas: Upper body skin exam   Relevant exam findings are noted in the Assessment and Plan.    Assessment & Plan   Purpura - Chronic; persistent and recurrent.  Treatable, but not curable. - Violaceous macules and patches - Benign - Related to trauma, age, sun damage and/or use of blood thinners, chronic use of topical and/or oral steroids - Observe - Can use OTC arnica containing moisturizer such as Dermend Bruise Formula if desired - Call for worsening or other concerns  Upper Body Skin Exam  LENTIGINES, SEBORRHEIC KERATOSES, HEMANGIOMAS - Benign normal skin lesions - Benign-appearing - Call for any changes  MELANOCYTIC NEVI - Tan-brown and/or pink-flesh-colored symmetric macules and papules - Benign appearing on exam today - Observation - Call clinic for new or changing moles - Recommend daily use of broad spectrum spf 30+ sunscreen to sun-exposed areas.   ACTINIC DAMAGE - Chronic condition, secondary to cumulative UV/sun exposure - diffuse scaly erythematous macules with underlying dyspigmentation - Recommend daily broad spectrum sunscreen SPF 30+ to sun-exposed areas, reapply every 2 hours as needed.  - Staying in the shade or wearing long sleeves, sun glasses (UVA+UVB protection) and wide brim hats  (4-inch brim around the entire circumference of the hat) are also recommended for sun protection.  - Call for new or changing lesions.  SKIN CANCER SCREENING PERFORMED TODAY  OTHER NONTHROMBOCYTOPENIC PURPURA (HCC)   SCREENING EXAM FOR SKIN CANCER   LENTIGINES   SEBORRHEIC KERATOSIS   CHERRY ANGIOMA   MULTIPLE BENIGN NEVI   ACTINIC SKIN DAMAGE    Return if symptoms worsen or fail to improve.  I, Gordan Beams, CMA, am acting as scribe for Martyna Thorns K, PA-C.   Documentation: I have reviewed the above documentation for accuracy and completeness, and I agree with the above.  Deantre Bourdon K, PA-C

## 2024-03-31 ENCOUNTER — Telehealth: Payer: Self-pay | Admitting: Radiation Oncology

## 2024-03-31 NOTE — Telephone Encounter (Signed)
 Spoke to pt regarding schedule chang for Dr. Izell. Pt agreeable to moving appts to 1/16

## 2024-05-16 ENCOUNTER — Encounter: Payer: Self-pay | Admitting: Radiation Oncology

## 2024-05-16 NOTE — Progress Notes (Incomplete)
 Pain issues, if any: *** Using a feeding tube?: *** Weight changes, if any: *** Swallowing issues, if any: *** Smoking or chewing tobacco? *** Using fluoride toothpaste daily? *** Last ENT visit was on: *** Other notable issues, if any: ***

## 2024-05-21 ENCOUNTER — Ambulatory Visit: Payer: Self-pay

## 2024-05-21 ENCOUNTER — Ambulatory Visit: Payer: Self-pay | Admitting: Radiation Oncology

## 2024-05-22 ENCOUNTER — Telehealth: Payer: Self-pay | Admitting: *Deleted

## 2024-05-22 NOTE — Telephone Encounter (Signed)
 Called patient to remind of lab and fu for 05-23-24, spoke with patient and he is aware of these appts.

## 2024-05-23 ENCOUNTER — Ambulatory Visit
Admission: RE | Admit: 2024-05-23 | Discharge: 2024-05-23 | Disposition: A | Discharge status: Home / Self Care | Payer: Self-pay | Source: Ambulatory Visit | Attending: Radiation Oncology | Admitting: Radiation Oncology

## 2024-05-23 ENCOUNTER — Other Ambulatory Visit: Payer: Self-pay

## 2024-05-23 ENCOUNTER — Ambulatory Visit
Admission: RE | Admit: 2024-05-23 | Discharge: 2024-05-23 | Disposition: A | Discharge status: Home / Self Care | Source: Ambulatory Visit | Attending: Radiation Oncology | Admitting: Radiation Oncology

## 2024-05-23 VITALS — BP 128/85 | HR 78 | Temp 97.8°F | Resp 18 | Ht 76.0 in | Wt 239.2 lb

## 2024-05-23 DIAGNOSIS — C320 Malignant neoplasm of glottis: Secondary | ICD-10-CM

## 2024-05-23 DIAGNOSIS — Z923 Personal history of irradiation: Secondary | ICD-10-CM | POA: Insufficient documentation

## 2024-05-23 DIAGNOSIS — Z8521 Personal history of malignant neoplasm of larynx: Secondary | ICD-10-CM | POA: Insufficient documentation

## 2024-05-23 DIAGNOSIS — Z79899 Other long term (current) drug therapy: Secondary | ICD-10-CM | POA: Insufficient documentation

## 2024-05-23 DIAGNOSIS — Z1329 Encounter for screening for other suspected endocrine disorder: Secondary | ICD-10-CM

## 2024-05-23 LAB — TSH: TSH: 1.29 u[IU]/mL (ref 0.350–4.500)

## 2024-05-23 MED ORDER — OXYMETAZOLINE HCL 0.05 % NA SOLN
1.0000 | Freq: Once | NASAL | Status: AC
  Administered 2024-05-23: 1 via NASAL
  Filled 2024-05-23: qty 0.1

## 2024-05-23 NOTE — Progress Notes (Signed)
 " Radiation Oncology         [phone] ________________________________  Name: Jesse Sharp MRN: [id number]  Date: 05/23/2024  DOB: 1946-03-09  Follow-Up Visit Note  CC: Bakare, Ezekiel NOVAK, MD  Jesse Ezekiel NOVAK, MD  Diagnosis and Prior Radiotherapy:       ICD-10-CM   1. Malignant neoplasm of glottis (HCC)  C32.0 oxymetazoline  (AFRIN) 0.05 % nasal spray 1 spray      Cancer Staging: Cancer Staging Malignant neoplasm of glottis (HCC) Staging form: Larynx - Glottis, AJCC 8th Edition - Clinical stage from 04/06/2020: Stage I (cT1b, cN0, cM0) - Signed by Izell Domino, MD on 04/07/2020 Stage prefix: Initial diagnosis    Radiation Treatment Dates: 04/26/2020 through 05/25/2020 Site Technique Total Dose (Gy) Dose per Fx (Gy) Completed Fx Beam Energies  Larynx: HN_larynx 3D 55/55 2.75 20/20 6X   CHIEF COMPLAINT:  Here for follow-up and surveillance of glottic cancer  Narrative:     Jesse Sharp presents today for a follow up after completing radiation to his larynx on 05/25/2020.  Pain issues, if any: Tightness on left side of neck which is stable Using a feeding tube?: None Weight changes, if any: Weight is stable. He does want to loose 10 to 15 minutes Wt Readings from Last 3 Encounters:  05/23/24 239 lb 3.2 oz (108.5 kg)  05/18/23 239 lb (108.4 kg)  09/01/22 236 lb 3.2 oz (107.1 kg)   Swallowing issues, if any: None Smoking or chewing tobacco? None Using fluoride toothpaste daily? Yes, flosses teeth twice daily Last ENT visit was on: Year ago Other notable issues, if any: None   ALLERGIES:  has no known allergies.  Meds: Current Outpatient Medications  Medication Sig Dispense Refill   ALPRAZolam (XANAX) 0.5 MG tablet Take 0.5 mg by mouth at bedtime as needed for anxiety.     losartan (COZAAR) 25 MG tablet Take 12.5 mg by mouth daily.     No current facility-administered medications for this encounter.    Physical Findings: The patient is in no acute distress.  Patient is alert and oriented. Wt Readings from Last 3 Encounters:  05/23/24 239 lb 3.2 oz (108.5 kg)  05/18/23 239 lb (108.4 kg)  09/01/22 236 lb 3.2 oz (107.1 kg)    height is 6' 4 (1.93 m) and weight is 239 lb 3.2 oz (108.5 kg). His temperature is 97.8 F (36.6 C). His blood pressure is 128/85 and his pulse is 78. His respiration is 18 and oxygen saturation is 100%. .  General: Alert and oriented, in no acute distress HEENT: Head is normocephalic. Extraocular movements are intact. Oropharynx is notable for clear mucosa Neck: No palpable adenopathy Skin: Skin in treatment fields shows satisfactory healing   Psychiatric: Judgment and insight are intact. Affect is appropriate. Ext: no edema Musculoskeletal exam shows that he is well-nourished and ambulatory with good muscle tone  LARYNGOSCOPY PROCEDURE NOTE: After obtaining consent and spraying nasal cavity with topical lidocaine  and oxymetazoline , the flexible endoscope was coated with lidocaine  gel and introduced and passed through the nasal cavity.  The nasopharynx, oropharynx, hypopharynx, and larynx were then examined. No lesions appreciated in the mucosal axis.  The true cords were symmetrically mobile and healthy appearing.  The patient tolerated the procedure well.   Lab Findings: Lab Results  Component Value Date   WBC 6.0 04/15/2020   HGB 15.6 04/15/2020   HCT 45.7 04/15/2020   MCV 88.4 04/15/2020   PLT 210 04/15/2020    Lab Results  Component Value Date   TSH 1.290 05/23/2024    Radiographic Findings: No results found.  Impression/Plan:    1) Head and Neck Cancer Status: No evidence of disease - see LARYNGOSCOPY PROCEDURE NOTE  2) Nutritional Status: No issues  3) Risk Factors: The patient has been educated about risk factors including tobacco abuse; they understand that avoidance of  tobacco is important to prevent recurrences as well as other cancers. He is abstaining from tobacco products and smoking in  general  4) Swallowing: Good function overall  5) Thyroid  function: WNL today - continue to check annually with PCP or otherwise at cancer center Lab Results  Component Value Date   TSH 1.290 05/23/2024    7) Follow-up with me in 12 months.  Follow-up with ENT in interim  8) He knows to call if he has any concerns or questions in the future.  On date of service, in total, I spent 25 minutes on this encounter. Patient was seen in person.    _____________________________________   Lauraine Golden, MD "

## 2024-05-26 NOTE — Progress Notes (Signed)
 Oncology Nurse Navigator Documentation   I met with Jesse Sharp briefly before his follow up with Dr. Izell on 05/23/24. He is recovering well after his treatment for his head and neck cancer. He will see Dr. Izell again in one year.   I sent fax to [facility] ENT Scheduling with request Mr. Wrightsman be contacted and scheduled for routine post-RT follow-up with Dr. Llewellyn or another provider in 6 months.  Notification of successful fax transmission received.   Delon Jefferson RN, BSN, OCN Head & Neck Oncology Nurse Navigator [facility] Phone # [phone]  Fax # [phone]

## 2024-12-15 ENCOUNTER — Ambulatory Visit: Admitting: Physician Assistant

## 2025-05-26 ENCOUNTER — Ambulatory Visit: Admitting: Radiation Oncology
